# Patient Record
Sex: Female | Born: 1970 | Race: White | Hispanic: No | Marital: Married
Health system: Southern US, Community
[De-identification: ages and names within clinical notes are randomized; demographics above are authoritative.]

## PROBLEM LIST (undated history)

## (undated) DIAGNOSIS — J9621 Acute and chronic respiratory failure with hypoxia: Secondary | ICD-10-CM

## (undated) DIAGNOSIS — J690 Pneumonitis due to inhalation of food and vomit: Secondary | ICD-10-CM

## (undated) DIAGNOSIS — I5032 Chronic diastolic (congestive) heart failure: Secondary | ICD-10-CM

## (undated) DIAGNOSIS — G6182 Multifocal motor neuropathy: Secondary | ICD-10-CM

## (undated) DIAGNOSIS — R6520 Severe sepsis without septic shock: Secondary | ICD-10-CM

## (undated) DIAGNOSIS — A419 Sepsis, unspecified organism: Secondary | ICD-10-CM

---

## 2018-08-04 ENCOUNTER — Inpatient Hospital Stay
Admission: AD | Admit: 2018-08-04 | Discharge: 2018-09-30 | Disposition: A | Discharge status: Skilled Nursing Facility | Payer: Medicaid Other | Source: Other Acute Inpatient Hospital | Attending: Internal Medicine | Admitting: Internal Medicine

## 2018-08-04 ENCOUNTER — Other Ambulatory Visit (HOSPITAL_COMMUNITY): Payer: Medicaid Other

## 2018-08-04 DIAGNOSIS — R52 Pain, unspecified: Secondary | ICD-10-CM

## 2018-08-04 DIAGNOSIS — T85598A Other mechanical complication of other gastrointestinal prosthetic devices, implants and grafts, initial encounter: Secondary | ICD-10-CM

## 2018-08-04 DIAGNOSIS — T17908A Unspecified foreign body in respiratory tract, part unspecified causing other injury, initial encounter: Secondary | ICD-10-CM

## 2018-08-04 DIAGNOSIS — J189 Pneumonia, unspecified organism: Secondary | ICD-10-CM

## 2018-08-04 DIAGNOSIS — K56609 Unspecified intestinal obstruction, unspecified as to partial versus complete obstruction: Secondary | ICD-10-CM

## 2018-08-04 DIAGNOSIS — J9621 Acute and chronic respiratory failure with hypoxia: Secondary | ICD-10-CM | POA: Diagnosis present

## 2018-08-04 DIAGNOSIS — R0602 Shortness of breath: Secondary | ICD-10-CM

## 2018-08-04 DIAGNOSIS — Z931 Gastrostomy status: Secondary | ICD-10-CM

## 2018-08-04 DIAGNOSIS — G6182 Multifocal motor neuropathy: Secondary | ICD-10-CM | POA: Diagnosis present

## 2018-08-04 DIAGNOSIS — R6520 Severe sepsis without septic shock: Secondary | ICD-10-CM | POA: Diagnosis present

## 2018-08-04 DIAGNOSIS — Z4659 Encounter for fitting and adjustment of other gastrointestinal appliance and device: Secondary | ICD-10-CM

## 2018-08-04 DIAGNOSIS — J690 Pneumonitis due to inhalation of food and vomit: Secondary | ICD-10-CM | POA: Diagnosis present

## 2018-08-04 DIAGNOSIS — R1012 Left upper quadrant pain: Secondary | ICD-10-CM

## 2018-08-04 DIAGNOSIS — R34 Anuria and oliguria: Secondary | ICD-10-CM

## 2018-08-04 DIAGNOSIS — R230 Cyanosis: Secondary | ICD-10-CM

## 2018-08-04 DIAGNOSIS — R509 Fever, unspecified: Secondary | ICD-10-CM

## 2018-08-04 DIAGNOSIS — Z431 Encounter for attention to gastrostomy: Secondary | ICD-10-CM

## 2018-08-04 DIAGNOSIS — I5032 Chronic diastolic (congestive) heart failure: Secondary | ICD-10-CM | POA: Diagnosis present

## 2018-08-04 DIAGNOSIS — J9690 Respiratory failure, unspecified, unspecified whether with hypoxia or hypercapnia: Secondary | ICD-10-CM

## 2018-08-04 DIAGNOSIS — A419 Sepsis, unspecified organism: Secondary | ICD-10-CM | POA: Diagnosis present

## 2018-08-04 DIAGNOSIS — K567 Ileus, unspecified: Secondary | ICD-10-CM

## 2018-08-04 DIAGNOSIS — Z95828 Presence of other vascular implants and grafts: Secondary | ICD-10-CM

## 2018-08-04 HISTORY — DX: Acute and chronic respiratory failure with hypoxia: J96.21

## 2018-08-04 HISTORY — DX: Severe sepsis without septic shock: R65.20

## 2018-08-04 HISTORY — DX: Sepsis, unspecified organism: R65.20

## 2018-08-04 HISTORY — DX: Pneumonitis due to inhalation of food and vomit: J69.0

## 2018-08-04 HISTORY — DX: Multifocal motor neuropathy: G61.82

## 2018-08-04 HISTORY — DX: Chronic diastolic (congestive) heart failure: I50.32

## 2018-08-04 HISTORY — DX: Sepsis, unspecified organism: A41.9

## 2018-08-04 MED ORDER — IOHEXOL 300 MG/ML  SOLN
50.0000 mL | Freq: Once | INTRAMUSCULAR | Status: AC | PRN
  Administered 2018-08-04: 50 mL via INTRATHECAL

## 2018-08-05 ENCOUNTER — Encounter: Payer: Self-pay | Admitting: Internal Medicine

## 2018-08-05 DIAGNOSIS — J690 Pneumonitis due to inhalation of food and vomit: Secondary | ICD-10-CM | POA: Diagnosis not present

## 2018-08-05 DIAGNOSIS — A419 Sepsis, unspecified organism: Secondary | ICD-10-CM | POA: Diagnosis present

## 2018-08-05 DIAGNOSIS — I5032 Chronic diastolic (congestive) heart failure: Secondary | ICD-10-CM | POA: Diagnosis not present

## 2018-08-05 DIAGNOSIS — J9621 Acute and chronic respiratory failure with hypoxia: Secondary | ICD-10-CM | POA: Diagnosis not present

## 2018-08-05 DIAGNOSIS — G6182 Multifocal motor neuropathy: Secondary | ICD-10-CM

## 2018-08-05 DIAGNOSIS — R6520 Severe sepsis without septic shock: Secondary | ICD-10-CM

## 2018-08-05 LAB — CBC
HCT: 24.5 % — ABNORMAL LOW (ref 36.0–46.0)
HCT: 24.9 % — ABNORMAL LOW (ref 36.0–46.0)
Hemoglobin: 7.4 g/dL — ABNORMAL LOW (ref 12.0–15.0)
Hemoglobin: 7.6 g/dL — ABNORMAL LOW (ref 12.0–15.0)
MCH: 28.8 pg (ref 26.0–34.0)
MCH: 29 pg (ref 26.0–34.0)
MCHC: 30.2 g/dL (ref 30.0–36.0)
MCHC: 30.5 g/dL (ref 30.0–36.0)
MCV: 95.3 fL (ref 80.0–100.0)
MCV: 95 fL (ref 80.0–100.0)
Platelets: 487 10*3/uL — ABNORMAL HIGH (ref 150–400)
Platelets: 533 10*3/uL — ABNORMAL HIGH (ref 150–400)
RBC: 2.57 MIL/uL — ABNORMAL LOW (ref 3.87–5.11)
RBC: 2.62 MIL/uL — ABNORMAL LOW (ref 3.87–5.11)
RDW: 15.8 % — ABNORMAL HIGH (ref 11.5–15.5)
RDW: 15.8 % — ABNORMAL HIGH (ref 11.5–15.5)
WBC: 9.1 10*3/uL (ref 4.0–10.5)
WBC: 8.4 10*3/uL (ref 4.0–10.5)
nRBC: 0 % (ref 0.0–0.2)
nRBC: 0 % (ref 0.0–0.2)

## 2018-08-05 LAB — BASIC METABOLIC PANEL
Anion gap: 11 (ref 5–15)
BUN: 24 mg/dL — ABNORMAL HIGH (ref 6–20)
CO2: 36 mmol/L — ABNORMAL HIGH (ref 22–32)
Calcium: 8.7 mg/dL — ABNORMAL LOW (ref 8.9–10.3)
Chloride: 98 mmol/L (ref 98–111)
Creatinine, Ser: <0.3 mg/dL — ABNORMAL LOW (ref 0.44–1.00)
Glucose, Bld: 100 mg/dL — ABNORMAL HIGH (ref 70–99)
Potassium: 3.2 mmol/L — ABNORMAL LOW (ref 3.5–5.1)
Sodium: 145 mmol/L (ref 135–145)

## 2018-08-05 MED ORDER — RELION START KIT KIT
25.00 | PACK | Status: DC
Start: 2018-08-04 — End: 2018-08-05

## 2018-08-05 MED ORDER — Medication
Status: DC
Start: ? — End: 2018-08-05

## 2018-08-05 MED ORDER — LOSARTAN POTASSIUM 50 MG PO TABS
50.00 | ORAL_TABLET | ORAL | Status: DC
Start: 2018-08-05 — End: 2018-08-05

## 2018-08-05 MED ORDER — ACETAMINOPHEN 650 MG RE SUPP
650.00 | RECTAL | Status: DC
Start: ? — End: 2018-08-05

## 2018-08-05 MED ORDER — EQUATE NICOTINE 4 MG MT GUM
4.00 | CHEWING_GUM | OROMUCOSAL | Status: DC
Start: ? — End: 2018-08-05

## 2018-08-05 MED ORDER — METOPROLOL TARTRATE 50 MG PO TABS
100.00 | ORAL_TABLET | ORAL | Status: DC
Start: 2018-08-04 — End: 2018-08-05

## 2018-08-05 MED ORDER — CALCIUM-MAGNESIUM-VITAMIN D PO
0.50 | ORAL | Status: DC
Start: 2018-08-04 — End: 2018-08-05

## 2018-08-05 MED ORDER — NITROGLYCERIN 0.4 MG SL SUBL
.40 | SUBLINGUAL_TABLET | SUBLINGUAL | Status: DC
Start: ? — End: 2018-08-05

## 2018-08-05 MED ORDER — CELLULOSE SODIUM PHOSPHATE VI
5000.00 | Status: DC
Start: 2018-08-04 — End: 2018-08-05

## 2018-08-05 MED ORDER — ALOE VESTA 2-N-1 SKIN COND EX LOTN
30.00 | TOPICAL_LOTION | CUTANEOUS | Status: DC
Start: 2018-08-04 — End: 2018-08-05

## 2018-08-05 MED ORDER — GUAIFENESIN 100 MG/5ML PO LIQD
200.00 | ORAL | Status: DC
Start: ? — End: 2018-08-05

## 2018-08-05 MED ORDER — GENERIC EXTERNAL MEDICATION
8.00 | Status: DC
Start: 2018-08-05 — End: 2018-08-05

## 2018-08-05 MED ORDER — BARO-CAT PO
10.00 | ORAL | Status: DC
Start: ? — End: 2018-08-05

## 2018-08-05 MED ORDER — ASPIRIN 81 MG PO CHEW
81.00 | CHEWABLE_TABLET | ORAL | Status: DC
Start: 2018-08-05 — End: 2018-08-05

## 2018-08-05 MED ORDER — GENERIC EXTERNAL MEDICATION
600.00 | Status: DC
Start: 2018-08-05 — End: 2018-08-05

## 2018-08-05 MED ORDER — POLYETHYLENE GLYCOL 3350 17 G PO PACK
17.00 | PACK | ORAL | Status: DC
Start: 2018-08-05 — End: 2018-08-05

## 2018-08-05 MED ORDER — IPRATROPIUM BROMIDE 0.02 % IN SOLN
.50 | RESPIRATORY_TRACT | Status: DC
Start: ? — End: 2018-08-05

## 2018-08-05 MED ORDER — ACETAMINOPHEN 160 MG/5ML PO SUSP
650.00 | ORAL | Status: DC
Start: 2018-08-04 — End: 2018-08-05

## 2018-08-05 NOTE — Consult Note (Signed)
Pulmonary Critical Care Medicine [facility] [place]  PULMONARY SERVICE  Date of Service: 08/05/2018  PULMONARY CRITICAL CARE CONSULT   Tiffany Atkinson  LKJ:[id number]  DOB: 1971/03/26   DOA: 08/04/2018  Referring Physician: Carron Curie, MD  HPI: Tiffany Atkinson is a 48 y.o. female seen for follow up of Acute on Chronic Respiratory Failure.  Patient has multiple medical problems including seizure disorder and posttraumatic stress disorder.  Patient had been recently admitted to the other facility with the possibility of Guillain-Barr syndrome this was apparently ruled out.  Patient presented with acute respiratory failure again with a diagnosis of pneumonia.  Patient ended up having to be intubated and placed on the ventilator.  Initially had been placed on BiPAP which did not work.  Subsequently patient had decline in status CT chest had shown the right lower lobe pneumonia there was concern for aspiration.  Patient did have cultures done sputum showed MRSA and so therefore was started on antibiotics.  As far as her neurological status is concerned she had EMG as well as neurological evaluation and it was felt that the changes were related to nutritional or metabolic disorder.  Patient did receive IVIG without any significant improvement.  Patient did undergo tracheostomy and a PEG tube placement prior to being transferred to our facility.  Right now is comfortable without distress.  Hospital course was as follows.  Patient was attempted several times and weaning did not do well.  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  Patient continued to fail assessments also failed SLP if assessment.  Tracheostomy as mentioned was done and then started weaning on trach collars on 3 May.  She was transferred to our facility for further management.  In addition at the other facility apparently patient was made DNR  Review of Systems:  ROS performed and is unremarkable other than noted above.   Past  Medical History:  Diagnosis Date  ?Marland Kitchen Anemia  PAST  ?Marland Kitchen Mastalgia 09/09/2017  ?Marland Kitchen Migraine  ?. Migraines  ?Marland Kitchen Nonpsychotic mental disorder  ?Marland Kitchen Ovarian cyst  ?. Ovarian cyst  BIL  ?Marland Kitchen Pelvic mass in female  ?. Seizure (*)  last seizure along time ago   Past Surgical History:  Procedure Laterality Date  ?. Bladder repair  ?. Cesarean section  ?Marland Kitchen Cholecystectomy  ?Marland Kitchen Hysterectomy  BSO  ?. Tubal ligation   Allergies  Allergen Reactions  ?Marland Kitchen Maxalt [Rizatriptan] Shortness Of Breath, Palpitations and Other  syncope syncope  ?Marland Kitchen Maxolon [Metoclopramide] Palpitations  ?Marland Kitchen Tramadol Shortness Of Breath  ?. Diclofenac Potassium Other  Pt reports she felt like she was going to pass out.  ?. Omeprazole Other  ?Marland Kitchen Potassium Nausea And Vomiting  ?. Sucralfate Other  ?Marland Kitchen Topamax [Topiramate] Other  Pt states "it makes me feel like I'm going to pass out"  ?. Unknown [Other] Other  Pt states there is a "powdered headache" medication that causes Syncope- she does not know the name.   Social History   Socioeconomic History  ?. Marital status: Married  Spouse name: Not on file  ?. Number of children: Not on file  ?. Years of education: Not on file  ?Marland Kitchen Highest education level: Not on file  Occupational History  ?Marland Kitchen Occupation: disabled  Social Needs  ?Marland Kitchen Financial resource strain: Not on file  ?Marland Kitchen Food insecurity  Worry: Not on file  Inability: Not on file  ?Marland Kitchen Transportation needs  Medical: Not on file  Non-medical: Not on file  Tobacco  Use  ?. Smoking status: Never Smoker  ?. Smokeless tobacco: Never Used  Substance and Sexual Activity  ?Marland Kitchen Alcohol use: No  Alcohol/week: 0.0 standard drinks  ?. Drug use: No      Family History: Non-Contributory to the present illness    Medications: Reviewed on Rounds  Physical Exam:  Vitals: Temperature 98.6 pulse 98 respiratory 15 blood pressure 110/72 saturations 100%  Ventilator Settings currently is off the ventilator on T  collar  . General: Comfortable at this time . Eyes: Grossly normal lids, irises & conjunctiva . ENT: grossly tongue is normal . Neck: no obvious mass . Cardiovascular: S1-S2 normal no gallop or rub . Respiratory: Coarse rhonchi noted bilaterally . Abdomen: Soft nontender . Skin: no rash seen on limited exam . Musculoskeletal: not rigid . Psychiatric:unable to assess . Neurologic: no seizure no involuntary movements         Labs on Admission:  Basic Metabolic Panel: Recent Labs  Lab 08/05/18 0537  NA 145  K 3.2*  CL 98  CO2 36*  GLUCOSE 100*  BUN 24*  CREATININE <0.30*  CALCIUM 8.7*    No results for input(s): PHART, PCO2ART, PO2ART, HCO3, O2SAT in the last 168 hours.  Liver Function Tests: No results for input(s): AST, ALT, ALKPHOS, BILITOT, PROT, ALBUMIN in the last 168 hours. No results for input(s): LIPASE, AMYLASE in the last 168 hours. No results for input(s): AMMONIA in the last 168 hours.  CBC: Recent Labs  Lab 08/05/18 0537 08/05/18 1448  WBC 9.1 8.4  HGB 7.4* 7.6*  HCT 24.5* 24.9*  MCV 95.3 95.0  PLT 487* 533*    Cardiac Enzymes: No results for input(s): CKTOTAL, CKMB, CKMBINDEX, TROPONINI in the last 168 hours.  BNP (last 3 results) No results for input(s): BNP in the last 8760 hours.  ProBNP (last 3 results) No results for input(s): PROBNP in the last 8760 hours.   Radiological Exams on Admission: Dg Abdomen Peg Tube Location  Result Date: 08/04/2018 CLINICAL DATA:  Peg tube verification EXAM: ABDOMEN - 1 VIEW COMPARISON:  None. FINDINGS: Single-view abdomen. Water-soluble contrast was injected through pre-existing PEG tube and a single overhead image obtained. There is contrast opacification of the stomach and proximal duodenum. There is reflux of contrast into the distal esophagus. No gross extravasation. There is diffuse gaseous enlargement of the colon. IMPRESSION: Contrast injection of peg tube opacifies the stomach, no gross extravasation.  Reflux of contrast to the distal esophagus. Diffuse gaseous dilatation of bowel, possible ileus Electronically Signed   By: Jasmine Pang M.D.   On: 08/04/2018 20:48    Assessment/Plan Active Problems:   Acute on chronic respiratory failure with hypoxia (HCC)   Aspiration pneumonia due to gastric secretions (HCC)   MMN (multifocal motor neuropathy) (HCC)   Severe sepsis (HCC)   Diastolic dysfunction with chronic heart failure (HCC)   1. Acute on chronic respiratory failure with hypoxia we will continue with weaning patient has been on a wean protocol and has been tolerating tracheostomy at the other facility.  We will continue to work on build on this and advance as tolerated. 2. Aspiration pneumonia treated we will continue present management supportive care. 3. Multifocal axonal motor neuropathy unclear etiology we will continue with supportive care. 4. Sepsis with bacteremia patient was treated will follow-up on cultures as necessary right now patient is hemodynamically stable. 5. Chronic diastolic heart failure monitor fluid status diuresis tolerated  I have personally seen and evaluated the patient, evaluated laboratory and  imaging results, formulated the assessment and plan and placed orders. The Patient requires high complexity decision making for assessment and support.  Case was discussed on Rounds with the Respiratory Therapy Staff Time Spent 70minutes  Yevonne PaxSaadat A Petro Talent, MD [facility] Pulmonary Critical Care Medicine Sleep Medicine

## 2018-08-06 ENCOUNTER — Other Ambulatory Visit (HOSPITAL_COMMUNITY): Payer: Medicaid Other

## 2018-08-06 DIAGNOSIS — J690 Pneumonitis due to inhalation of food and vomit: Secondary | ICD-10-CM | POA: Diagnosis not present

## 2018-08-06 DIAGNOSIS — A419 Sepsis, unspecified organism: Secondary | ICD-10-CM | POA: Diagnosis not present

## 2018-08-06 DIAGNOSIS — I5032 Chronic diastolic (congestive) heart failure: Secondary | ICD-10-CM | POA: Diagnosis not present

## 2018-08-06 DIAGNOSIS — J9621 Acute and chronic respiratory failure with hypoxia: Secondary | ICD-10-CM | POA: Diagnosis not present

## 2018-08-06 LAB — CBC WITH DIFFERENTIAL/PLATELET
Abs Immature Granulocytes: 0.04 10*3/uL (ref 0.00–0.07)
Basophils Absolute: 0.1 10*3/uL (ref 0.0–0.1)
Basophils Relative: 1 %
Eosinophils Absolute: 0 10*3/uL (ref 0.0–0.5)
Eosinophils Relative: 0 %
HCT: 27.6 % — ABNORMAL LOW (ref 36.0–46.0)
Hemoglobin: 8.5 g/dL — ABNORMAL LOW (ref 12.0–15.0)
Immature Granulocytes: 0 %
Lymphocytes Relative: 15 %
Lymphs Abs: 1.4 10*3/uL (ref 0.7–4.0)
MCH: 29 pg (ref 26.0–34.0)
MCHC: 30.8 g/dL (ref 30.0–36.0)
MCV: 94.2 fL (ref 80.0–100.0)
Monocytes Absolute: 0.5 10*3/uL (ref 0.1–1.0)
Monocytes Relative: 6 %
Neutro Abs: 7.1 10*3/uL (ref 1.7–7.7)
Neutrophils Relative %: 78 %
Platelets: 595 10*3/uL — ABNORMAL HIGH (ref 150–400)
RBC: 2.93 MIL/uL — ABNORMAL LOW (ref 3.87–5.11)
RDW: 15.5 % (ref 11.5–15.5)
WBC: 9.1 10*3/uL (ref 4.0–10.5)
nRBC: 0 % (ref 0.0–0.2)

## 2018-08-06 LAB — BLOOD GAS, ARTERIAL
Acid-Base Excess: 10.3 mmol/L — ABNORMAL HIGH (ref 0.0–2.0)
Bicarbonate: 35.1 mmol/L — ABNORMAL HIGH (ref 20.0–28.0)
FIO2: 28
O2 Saturation: 98.2 %
Patient temperature: 98.6
pCO2 arterial: 53.9 mmHg — ABNORMAL HIGH (ref 32.0–48.0)
pH, Arterial: 7.429 (ref 7.350–7.450)
pO2, Arterial: 98.9 mmHg (ref 83.0–108.0)

## 2018-08-06 LAB — C DIFFICILE QUICK SCREEN W PCR REFLEX
C Diff antigen: NEGATIVE
C Diff interpretation: NOT DETECTED
C Diff toxin: NEGATIVE

## 2018-08-06 MED ORDER — Medication
Status: DC
Start: ? — End: 2018-08-06

## 2018-08-06 NOTE — Progress Notes (Addendum)
Pulmonary Critical Care Medicine [facility] [place]   PULMONARY CRITICAL CARE SERVICE  PROGRESS NOTE  Date of Service: 08/06/2018  Tiffany Atkinson  [id number]  DOB: October 01, 1970   DOA: 08/04/2018  Referring Physician: Carron Curie, MD  HPI: Tiffany Atkinson is a 48 y.o. female seen for follow up of Acute on Chronic Respiratory Failure.  Patient remains on assist control mode rate of 12 with a FiO2 of 30%.  He is weaning to aerosol trach collar 28% FiO2  Medications: Reviewed on Rounds  Physical Exam:  Vitals: Pulse 100 respirations 18 BP 99/62 O2 sat 97% temp 97.6  Ventilator Settings auto mode AC VC rate of 12 tidal line 320 PEEP of 5 FiO2 30%  . General: Comfortable at this time . Eyes: Grossly normal lids, irises & conjunctiva . ENT: grossly tongue is normal . Neck: no obvious mass . Cardiovascular: S1 S2 normal no gallop . Respiratory: Coarse breath sounds . Abdomen: soft . Skin: no rash seen on limited exam . Musculoskeletal: not rigid . Psychiatric:unable to assess . Neurologic: no seizure no involuntary movements         Lab Data:   Basic Metabolic Panel: Recent Labs  Lab 08/05/18 0537  NA 145  K 3.2*  CL 98  CO2 36*  GLUCOSE 100*  BUN 24*  CREATININE <0.30*  CALCIUM 8.7*    ABG: Recent Labs  Lab 08/06/18 0605  PHART 7.429  PCO2ART 53.9*  PO2ART 98.9  HCO3 35.1*  O2SAT 98.2    Liver Function Tests: No results for input(s): AST, ALT, ALKPHOS, BILITOT, PROT, ALBUMIN in the last 168 hours. No results for input(s): LIPASE, AMYLASE in the last 168 hours. No results for input(s): AMMONIA in the last 168 hours.  CBC: Recent Labs  Lab 08/05/18 0537 08/05/18 1448 08/06/18 0324  WBC 9.1 8.4 9.1  NEUTROABS  --   --  7.1  HGB 7.4* 7.6* 8.5*  HCT 24.5* 24.9* 27.6*  MCV 95.3 95.0 94.2  PLT 487* 533* 595*    Cardiac Enzymes: No results for input(s): CKTOTAL, CKMB, CKMBINDEX, TROPONINI in the last 168 hours.  BNP (last 3  results) No results for input(s): BNP in the last 8760 hours.  ProBNP (last 3 results) No results for input(s): PROBNP in the last 8760 hours.  Radiological Exams: Dg Abdomen Peg Tube Location  Result Date: 08/04/2018 CLINICAL DATA:  Peg tube verification EXAM: ABDOMEN - 1 VIEW COMPARISON:  None. FINDINGS: Single-view abdomen. Water-soluble contrast was injected through pre-existing PEG tube and a single overhead image obtained. There is contrast opacification of the stomach and proximal duodenum. There is reflux of contrast into the distal esophagus. No gross extravasation. There is diffuse gaseous enlargement of the colon. IMPRESSION: Contrast injection of peg tube opacifies the stomach, no gross extravasation. Reflux of contrast to the distal esophagus. Diffuse gaseous dilatation of bowel, possible ileus Electronically Signed   By: Jasmine Pang M.D.   On: 08/04/2018 20:48   Dg Abd Portable 1v  Result Date: 08/06/2018 CLINICAL DATA:  Ileus EXAM: PORTABLE ABDOMEN - 1 VIEW COMPARISON:  Two days ago FINDINGS: Gaseous small and large bowel dilatation. Large bowel dilatation has somewhat improved especially in the left colon. The cecum measures up to 8 cm in diameter. Oral contrast seen previously is no longer visualized over the stomach, likely now positioned at the level of the rectum. IMPRESSION: Continued ileus pattern but improved distension. Electronically Signed   By: Marnee Spring M.D.   On:  08/06/2018 07:38    Assessment/Plan Active Problems:   Acute on chronic respiratory failure with hypoxia (HCC)   Aspiration pneumonia due to gastric secretions (HCC)   MMN (multifocal motor neuropathy) (HCC)   Severe sepsis (HCC)   Diastolic dysfunction with chronic heart failure (HCC)   1. Acute on chronic respiratory failure with hypoxia continue with weaning patient per weaning protocol.  Continue supportive measures and pulmonary toilet 2. Aspiration pneumonia treated continue present  management 3. 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 Sepsis with bacteremia treated, continue to follow 5. Chronic diastolic heart failure monitor fluid status and diuretics as tolerated   I have personally seen and evaluated the patient, evaluated laboratory and imaging results, formulated the assessment and plan and placed orders. The Patient requires high complexity decision making for assessment and support.  Case was discussed on Rounds with the Respiratory Therapy Staff  Tiffany PaxSaadat A Merryn Thaker, MD [facility] Pulmonary Critical Care Medicine Sleep Medicine

## 2018-08-07 ENCOUNTER — Other Ambulatory Visit (HOSPITAL_COMMUNITY): Payer: Medicaid Other

## 2018-08-07 DIAGNOSIS — J690 Pneumonitis due to inhalation of food and vomit: Secondary | ICD-10-CM | POA: Diagnosis not present

## 2018-08-07 DIAGNOSIS — A419 Sepsis, unspecified organism: Secondary | ICD-10-CM | POA: Diagnosis not present

## 2018-08-07 DIAGNOSIS — J9621 Acute and chronic respiratory failure with hypoxia: Secondary | ICD-10-CM | POA: Diagnosis not present

## 2018-08-07 DIAGNOSIS — I5032 Chronic diastolic (congestive) heart failure: Secondary | ICD-10-CM | POA: Diagnosis not present

## 2018-08-07 MED ORDER — GENERIC EXTERNAL MEDICATION
Status: DC
Start: ? — End: 2018-08-07

## 2018-08-07 NOTE — Progress Notes (Addendum)
[facility] [place]   PULMONARY CRITICAL CARE SERVICE  PROGRESS NOTE  Date of Service: 08/07/2018  Tiffany Atkinson  ZOX:[id number]  DOB: November 21, 1970   DOA: 08/04/2018  Referring Physician: Carron CurieAli Hijazi, MD  HPI: Tiffany Atkinson is a 48 y.o. female seen for follow up of Acute on Chronic Respiratory Failure.  Patient has now done 48 hours on aerosol trach collar 28% FiO2.  Resting comfortably with no distress.  Medications: Reviewed on Rounds  Physical Exam:  Vitals: Pulse 110 respirations 18 BP 135/78 O2 sat 98% temp 98.2  Ventilator Settings 28% ATC  . General: Comfortable at this time . Eyes: Grossly normal lids, irises & conjunctiva . ENT: grossly tongue is normal . Neck: no obvious mass . Cardiovascular: S1 S2 normal no gallop . Respiratory: Coarse breath sounds . Abdomen: soft . Skin: no rash seen on limited exam . Musculoskeletal: not rigid . Psychiatric:unable to assess . Neurologic: no seizure no involuntary movements         Lab Data:   Basic Metabolic Panel: Recent Labs  Lab 08/05/18 0537  NA 145  K 3.2*  CL 98  CO2 36*  GLUCOSE 100*  BUN 24*  CREATININE <0.30*  CALCIUM 8.7*    ABG: Recent Labs  Lab 08/06/18 0605  PHART 7.429  PCO2ART 53.9*  PO2ART 98.9  HCO3 35.1*  O2SAT 98.2    Liver Function Tests: No results for input(s): AST, ALT, ALKPHOS, BILITOT, PROT, ALBUMIN in the last 168 hours. No results for input(s): LIPASE, AMYLASE in the last 168 hours. No results for input(s): AMMONIA in the last 168 hours.  CBC: Recent Labs  Lab 08/05/18 0537 08/05/18 1448 08/06/18 0324  WBC 9.1 8.4 9.1  NEUTROABS  --   --  7.1  HGB 7.4* 7.6* 8.5*  HCT 24.5* 24.9* 27.6*  MCV 95.3 95.0 94.2  PLT 487* 533* 595*    Cardiac Enzymes: No results for input(s): CKTOTAL, CKMB, CKMBINDEX, TROPONINI in the last 168 hours.  BNP (last 3 results) No results for input(s): BNP in the last 8760 hours.  ProBNP (last  3 results) No results for input(s): PROBNP in the last 8760 hours.  Radiological Exams: Dg Abd 1 View  Result Date: 08/07/2018 CLINICAL DATA:  Ileus. Sepsis. EXAM: ABDOMEN - 1 VIEW COMPARISON:  08/06/2018 FINDINGS: There is persistent diffuse gaseous distension of small and large bowel throughout the abdomen. The cecum measures up to 8 cm in diameter, not significantly changed. Left-sided colonic dilatation has minimally increased while small bowel dilatation is stable to slightly increased. Contrast is again noted in the rectum. Right upper quadrant abdominal surgical clips are noted. IMPRESSION: Persistent ileus pattern with minimally increased bowel distension. Electronically Signed   By: Sebastian AcheAllen  Grady M.D.   On: 08/07/2018 09:40   Dg Abd Portable 1v  Result Date: 08/06/2018 CLINICAL DATA:  Ileus EXAM: PORTABLE ABDOMEN - 1 VIEW COMPARISON:  Two days ago FINDINGS: Gaseous small and large bowel dilatation. Large bowel dilatation has somewhat improved especially in the left colon. The cecum measures up to 8 cm in diameter. Oral contrast seen previously is no longer visualized over the stomach, likely now positioned at the level of the rectum. IMPRESSION: Continued ileus pattern but improved distension. Electronically Signed   By: Marnee SpringJonathon  Watts M.D.   On: 08/06/2018 07:38    Assessment/Plan Active Problems:   Acute on chronic respiratory failure with hypoxia (HCC)   Aspiration pneumonia due to gastric secretions (HCC)  MMN (multifocal motor neuropathy) (HCC)   Severe sepsis (HCC)   Diastolic dysfunction with chronic heart failure (HCC)   1. Acute on chronic respiratory failure with hypoxia continue with weaning patient per protocol continue supportive measures and pulmonary toilet 2. Aspiration pneumonia treated continue present management 3. Multifocal axonal motor neuropathy unclear etiology continue supportive care 4. Sepsis with bacteremia treated hemodynamically stable continue to  follow 5. Chronic diastolic heart failure monitor fluid status and diuresis as tolerated   I have personally seen and evaluated the patient, evaluated laboratory and imaging results, formulated the assessment and plan and placed orders. The Patient requires high complexity decision making for assessment and support.  Case was discussed on Rounds with the Respiratory Therapy Staff  Yevonne Pax, MD [facility] Pulmonary Critical Care Medicine Sleep Medicine

## 2018-08-08 ENCOUNTER — Other Ambulatory Visit (HOSPITAL_COMMUNITY): Payer: Medicaid Other

## 2018-08-08 DIAGNOSIS — J9621 Acute and chronic respiratory failure with hypoxia: Secondary | ICD-10-CM | POA: Diagnosis not present

## 2018-08-08 DIAGNOSIS — J690 Pneumonitis due to inhalation of food and vomit: Secondary | ICD-10-CM | POA: Diagnosis not present

## 2018-08-08 DIAGNOSIS — A419 Sepsis, unspecified organism: Secondary | ICD-10-CM | POA: Diagnosis not present

## 2018-08-08 DIAGNOSIS — I5032 Chronic diastolic (congestive) heart failure: Secondary | ICD-10-CM | POA: Diagnosis not present

## 2018-08-08 LAB — URINALYSIS, ROUTINE W REFLEX MICROSCOPIC
Bacteria, UA: NONE SEEN
Bilirubin Urine: NEGATIVE
Glucose, UA: NEGATIVE mg/dL
Ketones, ur: NEGATIVE mg/dL
Leukocytes,Ua: NEGATIVE
Nitrite: NEGATIVE
Protein, ur: 30 mg/dL — AB
RBC / HPF: >50 RBC/hpf — ABNORMAL HIGH (ref 0–5)
Specific Gravity, Urine: 1.018 (ref 1.005–1.030)
pH: 5 (ref 5.0–8.0)

## 2018-08-08 MED ORDER — Medication
Status: DC
Start: ? — End: 2018-08-08

## 2018-08-08 MED ORDER — GENERIC EXTERNAL MEDICATION
Status: DC
Start: ? — End: 2018-08-08

## 2018-08-08 NOTE — Progress Notes (Addendum)
Pulmonary Critical Care Medicine [facility] [place]   PULMONARY CRITICAL CARE SERVICE  [facility]  Date of Service: 08/09/2018  Tiffany Atkinson [place]  WUX:[id number]  DOB: Oct 20, 1970   DOA: 08/04/2018  Referring Physician: Carron CurieAli Hijazi, MD  HPI: Tiffany Atkinson Tiffany Atkinson is Atkinson 48 y.o. female seen for follow up of Acute on Chronic Respiratory Failure.  Patient remains on aerosol trach collar 35% at this time.  Resting comfortably with no distress  Medications: Reviewed on Rounds  Physical Exam:  Vitals: Pulse 97 respirations 22 BP 159/94 O2 sat 91% temp 97.6  Ventilator Settings 28% ATC  . General: Comfortable at this time . Eyes: Grossly normal lids, irises & conjunctiva . ENT: grossly tongue is normal . Neck: no obvious mass . Cardiovascular: S1 S2 normal no gallop . Respiratory: Coarse breath sounds . Abdomen: soft . Skin: no rash seen on limited exam . Musculoskeletal: not rigid . Psychiatric:unable to assess . Neurologic: no seizure no involuntary movements         Lab Data:   Basic Metabolic Panel: Recent Labs  Lab 08/05/18 0537 08/09/18 0602 08/09/18 1049  NA 145 142 140  K 3.2* 2.3* 3.8  CL 98 97* 100  CO2 36* 36* 32  GLUCOSE 100* 111* 99  BUN 24* 10 12  CREATININE <0.30* <0.30* <0.30*  CALCIUM 8.7* 8.1* 8.1*    ABG: Recent Labs  Lab 08/06/18 0605 08/09/18 0830  PHART 7.429 7.383  PCO2ART 53.9* 62.6*  PO2ART 98.9 65.6*  HCO3 35.1* 36.5*  O2SAT 98.2 92.0    Liver Function Tests: Recent Labs  Lab 08/09/18 1049  AST 33  ALT 24  ALKPHOS 63  BILITOT 0.5  PROT 5.0*  ALBUMIN 2.1*   No results for input(s): LIPASE, AMYLASE in the last 168 hours. Recent Labs  Lab 08/09/18 1050  AMMONIA 56*    CBC: Recent Labs  Lab 08/05/18 0537 08/05/18 1448 08/06/18 0324 08/09/18 1049  WBC 9.1 8.4 9.1 9.3  NEUTROABS  --   --  7.1  --   HGB 7.4* 7.6* 8.5* 8.2*  HCT 24.5* 24.9* 27.6* 25.9*  MCV 95.3 95.0 94.2 91.8  PLT 487* 533* 595* 679*     Cardiac Enzymes: No results for input(s): CKTOTAL, CKMB, CKMBINDEX, TROPONINI in the last 168 hours.  BNP (last 3 results) No results for input(s): BNP in the last 8760 hours.  ProBNP (last 3 results) No results for input(s): PROBNP in the last 8760 hours.  Radiological Exams: Dg Abd 1 View  Result Date: 08/08/2018 CLINICAL DATA:  Ileus. Central abdominal pain. EXAM: ABDOMEN - 1 VIEW COMPARISON:  08/07/2018 FINDINGS: Diffuse gaseous distension of the colon has not significantly changed. Gaseous small bowel distention has decreased. Contrast remains in the rectum. No gross intraperitoneal free air is identified on this supine study. Right upper quadrant abdominal surgical clips are noted. No acute osseous abnormality is seen. IMPRESSION: Persistent ileus pattern with decreased small bowel distension. Electronically Signed   By: Sebastian AcheAllen  Grady M.D.   On: 08/08/2018 07:50   [place] Renal  Result Date: 08/08/2018 CLINICAL DATA:  Decreased urinary output. EXAM: RENAL / URINARY TRACT ULTRASOUND COMPLETE COMPARISON:  None. FINDINGS: Suboptimal assessment due to patient inability to change position and poor sonic windows. Right Kidney: Renal measurements: 11.4 by 5.3 by 5.3 = volume: 170 mL . Echogenicity within normal limits. No mass or hydronephrosis visualized. Left Kidney: Renal measurements: 12.3 by 7.0 by 6.5 cm = volume: 292 mL. Echogenicity within normal limits. No  mass or hydronephrosis visualized. The lower pole the left kidney was poorly seen due to difficult sonic windows. Bladder: Appears normal for degree of bladder distention. Atkinson Foley catheter is present. Other: Perihepatic and pelvic ascites. IMPRESSION: 1. Ascites. 2. No specific renal or bladder abnormality is identified, but sensitivity is mildly reduced due to poor sonic windows causing difficulties visualizing parts of the kidneys including the left kidney lower pole. Electronically Signed   By: Gaylyn Rong M.D.   On: 08/08/2018  17:58   Dg Abd Portable 1v  Result Date: 08/09/2018 CLINICAL DATA:  48 year old female with sepsis, possible ileus. EXAM: PORTABLE ABDOMEN - 1 VIEW COMPARISON:  08/07/2018 and earlier. FINDINGS: Portable AP supine view at 0607 hours. Gas-filled small and large bowel loops throughout the abdomen with stable caliber at the upper limits of normal since 08/06/2018. No pneumoperitoneum evident on this supine view. Stable cholecystectomy clips. Negative visible osseous structures. IMPRESSION: Stable gas-filled small and large bowel loops throughout the abdomen since 08/06/2018 compatible with ileus. Electronically Signed   By: Odessa Fleming M.D.   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 Acute on chronic respiratory failure with hypoxia continue weaning per protocol.  Continue supportive measures pulmonary toilet 2. Aspiration pneumonia treated continue present management 3. Multifocal anoxia motor neuropathy unclear etiology continue supportive care 4. Sepsis with bacteremia treated hemodynamically stable 5. Chronic diastolic heart failure monitor fluid status and diurese as tolerated   I have personally seen and evaluated the patient, evaluated laboratory and imaging results, formulated the assessment and plan and placed orders. The Patient requires high complexity decision making for assessment and support.  Case was discussed on Rounds with the Respiratory Therapy Staff  Yevonne Pax, MD [facility] Pulmonary Critical Care Medicine Sleep Medicine

## 2018-08-09 ENCOUNTER — Other Ambulatory Visit (HOSPITAL_COMMUNITY): Payer: Medicaid Other

## 2018-08-09 ENCOUNTER — Encounter: Payer: Self-pay | Admitting: Radiology

## 2018-08-09 DIAGNOSIS — J690 Pneumonitis due to inhalation of food and vomit: Secondary | ICD-10-CM | POA: Diagnosis not present

## 2018-08-09 DIAGNOSIS — I5032 Chronic diastolic (congestive) heart failure: Secondary | ICD-10-CM | POA: Diagnosis not present

## 2018-08-09 DIAGNOSIS — J9621 Acute and chronic respiratory failure with hypoxia: Secondary | ICD-10-CM | POA: Diagnosis not present

## 2018-08-09 DIAGNOSIS — A419 Sepsis, unspecified organism: Secondary | ICD-10-CM | POA: Diagnosis not present

## 2018-08-09 LAB — CBC
HCT: 25.9 % — ABNORMAL LOW (ref 36.0–46.0)
Hemoglobin: 8.2 g/dL — ABNORMAL LOW (ref 12.0–15.0)
MCH: 29.1 pg (ref 26.0–34.0)
MCHC: 31.7 g/dL (ref 30.0–36.0)
MCV: 91.8 fL (ref 80.0–100.0)
Platelets: 679 10*3/uL — ABNORMAL HIGH (ref 150–400)
RBC: 2.82 MIL/uL — ABNORMAL LOW (ref 3.87–5.11)
RDW: 15 % (ref 11.5–15.5)
WBC: 9.3 10*3/uL (ref 4.0–10.5)
nRBC: 0 % (ref 0.0–0.2)

## 2018-08-09 LAB — AMMONIA: Ammonia: 56 umol/L — ABNORMAL HIGH (ref 9–35)

## 2018-08-09 LAB — BASIC METABOLIC PANEL
Anion gap: 9 (ref 5–15)
BUN: 10 mg/dL (ref 6–20)
CO2: 36 mmol/L — ABNORMAL HIGH (ref 22–32)
Calcium: 8.1 mg/dL — ABNORMAL LOW (ref 8.9–10.3)
Chloride: 97 mmol/L — ABNORMAL LOW (ref 98–111)
Creatinine, Ser: <0.3 mg/dL — ABNORMAL LOW (ref 0.44–1.00)
Glucose, Bld: 111 mg/dL — ABNORMAL HIGH (ref 70–99)
Potassium: 2.3 mmol/L — CL (ref 3.5–5.1)
Sodium: 142 mmol/L (ref 135–145)

## 2018-08-09 LAB — COMPREHENSIVE METABOLIC PANEL
ALT: 24 U/L (ref 0–44)
AST: 33 U/L (ref 15–41)
Albumin: 2.1 g/dL — ABNORMAL LOW (ref 3.5–5.0)
Alkaline Phosphatase: 63 U/L (ref 38–126)
Anion gap: 8 (ref 5–15)
BUN: 12 mg/dL (ref 6–20)
CO2: 32 mmol/L (ref 22–32)
Calcium: 8.1 mg/dL — ABNORMAL LOW (ref 8.9–10.3)
Chloride: 100 mmol/L (ref 98–111)
Creatinine, Ser: <0.3 mg/dL — ABNORMAL LOW (ref 0.44–1.00)
Glucose, Bld: 99 mg/dL (ref 70–99)
Potassium: 3.8 mmol/L (ref 3.5–5.1)
Sodium: 140 mmol/L (ref 135–145)
Total Bilirubin: 0.5 mg/dL (ref 0.3–1.2)
Total Protein: 5 g/dL — ABNORMAL LOW (ref 6.5–8.1)

## 2018-08-09 LAB — BLOOD GAS, ARTERIAL
Acid-Base Excess: 11.1 mmol/L — ABNORMAL HIGH (ref 0.0–2.0)
Bicarbonate: 36.5 mmol/L — ABNORMAL HIGH (ref 20.0–28.0)
FIO2: 60
O2 Saturation: 92 %
Patient temperature: 98.6
pCO2 arterial: 62.6 mmHg — ABNORMAL HIGH (ref 32.0–48.0)
pH, Arterial: 7.383 (ref 7.350–7.450)
pO2, Arterial: 65.6 mmHg — ABNORMAL LOW (ref 83.0–108.0)

## 2018-08-09 LAB — LACTIC ACID, PLASMA: Lactic Acid, Venous: 0.5 mmol/L (ref 0.5–1.9)

## 2018-08-09 LAB — PROCALCITONIN: Procalcitonin: <0.1 ng/mL

## 2018-08-09 MED ORDER — Medication
Status: DC
Start: ? — End: 2018-08-09

## 2018-08-09 MED ORDER — IOHEXOL 300 MG/ML  SOLN
100.0000 mL | Freq: Once | INTRAMUSCULAR | Status: AC | PRN
  Administered 2018-09-08: 21:00:00 100 mL via INTRAVENOUS

## 2018-08-09 NOTE — Progress Notes (Addendum)
Pulmonary Critical Care Medicine [facility] [place]   PULMONARY CRITICAL CARE SERVICE  PROGRESS NOTE  Date of Service: 08/09/2018  Tiffany Atkinson  SEG:[id number]  DOB: 1970-06-24   DOA: 08/04/2018  Referring Physician: Carron Curie, MD  HPI: Tiffany Atkinson is a 48 y.o. female seen for follow up of Acute on Chronic Respiratory Failure.  Patient remains on 40% aerosol trach collar still requiring Ventimask at night at times currently resting comfortably with no acute distress.  Medications: Reviewed on Rounds  Physical Exam:  Vitals: Pulse 91 respirations 21 BP 139/76 O2 sat 94% temp 98.9  Ventilator Settings ATC 40%  . General: Comfortable at this time . Eyes: Grossly normal lids, irises & conjunctiva . ENT: grossly tongue is normal . Neck: no obvious mass . Cardiovascular: S1 S2 normal no gallop . Respiratory: Coarse breath sounds . Abdomen: soft . Skin: no rash seen on limited exam . Musculoskeletal: not rigid . Psychiatric:unable to assess . Neurologic: no seizure no involuntary movements         Lab Data:   Basic Metabolic Panel: Recent Labs  Lab 08/05/18 0537 08/09/18 0602 08/09/18 1049  NA 145 142 140  K 3.2* 2.3* 3.8  CL 98 97* 100  CO2 36* 36* 32  GLUCOSE 100* 111* 99  BUN 24* 10 12  CREATININE <0.30* <0.30* <0.30*  CALCIUM 8.7* 8.1* 8.1*    ABG: Recent Labs  Lab 08/06/18 0605 08/09/18 0830  PHART 7.429 7.383  PCO2ART 53.9* 62.6*  PO2ART 98.9 65.6*  HCO3 35.1* 36.5*  O2SAT 98.2 92.0    Liver Function Tests: Recent Labs  Lab 08/09/18 1049  AST 33  ALT 24  ALKPHOS 63  BILITOT 0.5  PROT 5.0*  ALBUMIN 2.1*   No results for input(s): LIPASE, AMYLASE in the last 168 hours. Recent Labs  Lab 08/09/18 1050  AMMONIA 56*    CBC: Recent Labs  Lab 08/05/18 0537 08/05/18 1448 08/06/18 0324 08/09/18 1049  WBC 9.1 8.4 9.1 9.3  NEUTROABS  --   --  7.1  --   HGB 7.4* 7.6* 8.5* 8.2*  HCT 24.5* 24.9* 27.6* 25.9*  MCV 95.3  95.0 94.2 91.8  PLT 487* 533* 595* 679*    Cardiac Enzymes: No results for input(s): CKTOTAL, CKMB, CKMBINDEX, TROPONINI in the last 168 hours.  BNP (last 3 results) No results for input(s): BNP in the last 8760 hours.  ProBNP (last 3 results) No results for input(s): PROBNP in the last 8760 hours.  Radiological Exams: Dg Abd 1 View  Result Date: 08/08/2018 CLINICAL DATA:  Ileus. Central abdominal pain. EXAM: ABDOMEN - 1 VIEW COMPARISON:  08/07/2018 FINDINGS: Diffuse gaseous distension of the colon has not significantly changed. Gaseous small bowel distention has decreased. Contrast remains in the rectum. No gross intraperitoneal free air is identified on this supine study. Right upper quadrant abdominal surgical clips are noted. No acute osseous abnormality is seen. IMPRESSION: Persistent ileus pattern with decreased small bowel distension. Electronically Signed   By: Sebastian Ache M.D.   On: 08/08/2018 07:50   US Renal  Result Date: 08/08/2018 CLINICAL DATA:  Decreased urinary output. EXAM: RENAL / URINARY TRACT ULTRASOUND COMPLETE COMPARISON:  None. FINDINGS: Suboptimal assessment due to patient inability to change position and poor sonic windows. Right Kidney: Renal measurements: 11.4 by 5.3 by 5.3 = volume: 170 mL . Echogenicity within normal limits. No mass or hydronephrosis visualized. Left Kidney: Renal measurements: 12.3 by 7.0 by 6.5 cm = volume: 292 mL.  Echogenicity within normal limits. No mass or hydronephrosis visualized. The lower pole the left kidney was poorly seen due to difficult sonic windows. Bladder: Appears normal for degree of bladder distention. A Foley catheter is present. Other: Perihepatic and pelvic ascites. IMPRESSION: 1. Ascites. 2. No specific renal or bladder abnormality is identified, but sensitivity is mildly reduced due to poor sonic windows causing difficulties visualizing parts of the kidneys including the left kidney lower pole. Electronically Signed   By:  Gaylyn RongWalter  Liebkemann M.D.   On: 08/08/2018 17:58   Dg Abd Portable 1v  Result Date: 08/09/2018 CLINICAL DATA:  48 year old female with sepsis, possible ileus. EXAM: PORTABLE ABDOMEN - 1 VIEW COMPARISON:  08/07/2018 and earlier. FINDINGS: Portable AP supine view at 0607 hours. Gas-filled small and large bowel loops throughout the abdomen with stable caliber at the upper limits of normal since 08/06/2018. No pneumoperitoneum evident on this supine view. Stable cholecystectomy clips. Negative visible osseous structures. IMPRESSION: Stable gas-filled small and large bowel loops throughout the abdomen since 08/06/2018 compatible with ileus. Electronically Signed   By: Odessa FlemingH  Hall M.D.   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 Acute on chronic respiratory failure with hypoxia continue weaning per protocol.  Continue supportive measures and pulmonary toilet.  Currently on aerosol trach collar 40% O2. 2. Aspiration pneumonia treated continue present management 3. Multifocal anoxic motor neuropathy unclear etiology continue supportive care 4. Sepsis with bacteremia treated hemodynamically stable 5. Chronic diastolic heart failure monitor fluid status and diurese as tolerated   I have personally seen and evaluated the patient, evaluated laboratory and imaging results, formulated the assessment and plan and placed orders. The Patient requires high complexity decision making for assessment and support.  Case was discussed on Rounds with the Respiratory Therapy Staff  Yevonne PaxSaadat A , MD [facility] Pulmonary Critical Care Medicine Sleep Medicine

## 2018-08-10 ENCOUNTER — Other Ambulatory Visit (HOSPITAL_COMMUNITY): Payer: Medicaid Other

## 2018-08-10 DIAGNOSIS — J9621 Acute and chronic respiratory failure with hypoxia: Secondary | ICD-10-CM | POA: Diagnosis not present

## 2018-08-10 DIAGNOSIS — J690 Pneumonitis due to inhalation of food and vomit: Secondary | ICD-10-CM | POA: Diagnosis not present

## 2018-08-10 DIAGNOSIS — A419 Sepsis, unspecified organism: Secondary | ICD-10-CM | POA: Diagnosis not present

## 2018-08-10 DIAGNOSIS — I5032 Chronic diastolic (congestive) heart failure: Secondary | ICD-10-CM | POA: Diagnosis not present

## 2018-08-10 LAB — BLOOD GAS, ARTERIAL
Acid-Base Excess: 12.5 mmol/L — ABNORMAL HIGH (ref 0.0–2.0)
Bicarbonate: 36.1 mmol/L — ABNORMAL HIGH (ref 20.0–28.0)
FIO2: 30
O2 Saturation: 97.2 %
PEEP: 5 cmH2O
Patient temperature: 98.6
Pressure control: 20 cmH2O
RATE: 12 resp/min
pCO2 arterial: 41.2 mmHg (ref 32.0–48.0)
pH, Arterial: 7.551 — ABNORMAL HIGH (ref 7.350–7.450)
pO2, Arterial: 72.3 mmHg — ABNORMAL LOW (ref 83.0–108.0)

## 2018-08-10 LAB — BASIC METABOLIC PANEL
Anion gap: 9 (ref 5–15)
BUN: 9 mg/dL (ref 6–20)
CO2: 31 mmol/L (ref 22–32)
Calcium: 8 mg/dL — ABNORMAL LOW (ref 8.9–10.3)
Chloride: 99 mmol/L (ref 98–111)
Creatinine, Ser: <0.3 mg/dL — ABNORMAL LOW (ref 0.44–1.00)
Glucose, Bld: 87 mg/dL (ref 70–99)
Potassium: 3 mmol/L — ABNORMAL LOW (ref 3.5–5.1)
Sodium: 139 mmol/L (ref 135–145)

## 2018-08-10 LAB — CBC
HCT: 23.3 % — ABNORMAL LOW (ref 36.0–46.0)
Hemoglobin: 7.3 g/dL — ABNORMAL LOW (ref 12.0–15.0)
MCH: 28.7 pg (ref 26.0–34.0)
MCHC: 31.3 g/dL (ref 30.0–36.0)
MCV: 91.7 fL (ref 80.0–100.0)
Platelets: 499 10*3/uL — ABNORMAL HIGH (ref 150–400)
RBC: 2.54 MIL/uL — ABNORMAL LOW (ref 3.87–5.11)
RDW: 15.3 % (ref 11.5–15.5)
WBC: 6.2 10*3/uL (ref 4.0–10.5)
nRBC: 0 % (ref 0.0–0.2)

## 2018-08-10 LAB — PROCALCITONIN: Procalcitonin: <0.1 ng/mL

## 2018-08-10 MED ORDER — GENERIC EXTERNAL MEDICATION
Status: DC
Start: ? — End: 2018-08-10

## 2018-08-10 NOTE — Progress Notes (Addendum)
[facility] [place]   PULMONARY CRITICAL CARE SERVICE  PROGRESS NOTE  Date of Service: 08/10/2018  Tiffany Applebaumngela A Foulk  ZOX:[id number]  DOB: 1970/05/27   DOA: 08/04/2018  Referring Physician: Carron CurieAli Hijazi, MD  HPI: Tiffany Atkinson is a 48 y.o. female seen for follow up of Acute on Chronic Respiratory Failure.  Patient remains on aerosol trach collar 35% FiO2 during the day and rest on the ventilator at night.  Doing well with no distress at this time.  Medications: Reviewed on Rounds  Physical Exam:  Vitals: Pulse 98 respirations 30 BP 130/75 O2 sat 99% temp 97.0  Ventilator Settings ATC 35%  . General: Comfortable at this time . Eyes: Grossly normal lids, irises & conjunctiva . ENT: grossly tongue is normal . Neck: no obvious mass . Cardiovascular: S1 S2 normal no gallop . Respiratory: Coarse breath sounds . Abdomen: soft . Skin: no rash seen on limited exam . Musculoskeletal: not rigid . Psychiatric:unable to assess . Neurologic: no seizure no involuntary movements         Lab Data:   Basic Metabolic Panel: Recent Labs  Lab 08/05/18 0537 08/09/18 0602 08/09/18 1049 08/10/18 0753  NA 145 142 140 139  K 3.2* 2.3* 3.8 3.0*  CL 98 97* 100 99  CO2 36* 36* 32 31  GLUCOSE 100* 111* 99 87  BUN 24* 10 12 9   CREATININE <0.30* <0.30* <0.30* <0.30*  CALCIUM 8.7* 8.1* 8.1* 8.0*    ABG: Recent Labs  Lab 08/06/18 0605 08/09/18 0830 08/10/18 0355  PHART 7.429 7.383 7.551*  PCO2ART 53.9* 62.6* 41.2  PO2ART 98.9 65.6* 72.3*  HCO3 35.1* 36.5* 36.1*  O2SAT 98.2 92.0 97.2    Liver Function Tests: Recent Labs  Lab 08/09/18 1049  AST 33  ALT 24  ALKPHOS 63  BILITOT 0.5  PROT 5.0*  ALBUMIN 2.1*   No results for input(s): LIPASE, AMYLASE in the last 168 hours. Recent Labs  Lab 08/09/18 1050  AMMONIA 56*    CBC: Recent Labs  Lab 08/05/18 0537 08/05/18 1448 08/06/18 0324 08/09/18 1049 08/10/18 0753  WBC 9.1 8.4  9.1 9.3 6.2  NEUTROABS  --   --  7.1  --   --   HGB 7.4* 7.6* 8.5* 8.2* 7.3*  HCT 24.5* 24.9* 27.6* 25.9* 23.3*  MCV 95.3 95.0 94.2 91.8 91.7  PLT 487* 533* 595* 679* 499*    Cardiac Enzymes: No results for input(s): CKTOTAL, CKMB, CKMBINDEX, TROPONINI in the last 168 hours.  BNP (last 3 results) No results for input(s): BNP in the last 8760 hours.  ProBNP (last 3 results) No results for input(s): PROBNP in the last 8760 hours.  Radiological Exams: Ct Abdomen Pelvis Wo Contrast  Result Date: 08/09/2018 CLINICAL DATA:  Abdominal distension in a patient with ascites. EXAM: CT ABDOMEN AND PELVIS WITHOUT CONTRAST TECHNIQUE: Multidetector CT imaging of the abdomen and pelvis was performed following the standard protocol without IV contrast. COMPARISON:  Plain films of the abdomen since 08/06/2018. FINDINGS: Lower chest: There is cardiomegaly. The patient has dense bilateral lower lobe airspace disease with air bronchograms present. Small pleural effusions are identified. Hepatobiliary: No focal liver abnormality is seen. Status post cholecystectomy. No biliary dilatation. Pancreas: Unremarkable. No pancreatic ductal dilatation or surrounding inflammatory changes. Spleen: Normal in size without focal abnormality. Adrenals/Urinary Tract: Adrenal glands are unremarkable. Kidneys are normal, without renal calculi, focal lesion, or hydronephrosis. Bladder is unremarkable. Foley catheter noted. Stomach/Bowel: There is no evidence of  bowel obstruction. Moderate gaseous distention of the colon is noted. There is contrast material in the descending colon through the rectum. No free air, pneumatosis or portal venous gas is identified. PEG tube is in place. Vascular/Lymphatic: No significant vascular findings are present. No enlarged abdominal or pelvic lymph nodes. Reproductive: Status post hysterectomy. No adnexal masses. Other: There is a small volume of upper abdominal ascites. Body wall edema is noted.  Musculoskeletal: No focal lesion. Convex left lumbar curvature may be positional. IMPRESSION: Small volume of upper abdominal ascites. Volume does not appear large enough for paracentesis. Moderate gaseous distention of the colon. Negative for bowel obstruction. Dense bilateral lower lobe airspace disease with associated small pleural effusions most consistent with pneumonia. Cardiomegaly. Electronically Signed   By: Drusilla Kanner M.D.   On: 08/09/2018 17:36   Dg Chest Port 1 View  Result Date: 08/09/2018 CLINICAL DATA:  Status post PICC placement. EXAM: PORTABLE CHEST 1 VIEW COMPARISON:  Abdominal CT 08/09/2018 FINDINGS: Tracheostomy tube overlies the airway. A right PICC terminates over the lower SVC. The cardiac silhouette is borderline enlarged. Lung volumes are low with basilar airspace consolidation and small pleural effusions bilaterally. No pneumothorax is identified. Moderate gaseous distension of the colon is partially visualized, more fully evaluated on earlier CT. No acute osseous abnormality is identified. IMPRESSION: 1. Right PICC terminates over the lower SVC. 2. Low lung volumes with bibasilar airspace consolidation and small pleural effusions. Electronically Signed   By: Sebastian Ache M.D.   On: 08/09/2018 21:45   Dg Abd Portable 1v  Result Date: 08/10/2018 CLINICAL DATA:  Ileus EXAM: PORTABLE ABDOMEN - 1 VIEW COMPARISON:  08/09/2018 FINDINGS: Scattered large and small bowel gas is noted. Degree of distention of the colon has improved slightly in the interval. No true obstructive changes are seen. No free air is noted. No bony abnormality is seen. Gastrostomy catheter is noted on the left. IMPRESSION: Gaseous distension of large and small bowel although slightly improved when compared with the prior exam. Electronically Signed   By: Alcide Clever M.D.   On: 08/10/2018 07:14   Dg Abd Portable 1v  Result Date: 08/09/2018 CLINICAL DATA:  48 year old female with sepsis, possible ileus.  EXAM: PORTABLE ABDOMEN - 1 VIEW COMPARISON:  08/07/2018 and earlier. FINDINGS: Portable AP supine view at 0607 hours. Gas-filled small and large bowel loops throughout the abdomen with stable caliber at the upper limits of normal since 08/06/2018. No pneumoperitoneum evident on this supine view. Stable cholecystectomy clips. Negative visible osseous structures. IMPRESSION: Stable gas-filled small and large bowel loops throughout the abdomen since 08/06/2018 compatible with ileus. Electronically Signed   By: Odessa Fleming M.D.   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 Acute on chronic respiratory failure with hypoxia continue weaning per protocol.  Continue pulmonary toilet and secretion management.  Continue with aerosol trach collar during the day of the night. 2. Aspiration pneumonia treated continue present management 3. Multifocal anoxic motor neuropathy unclear etiology continue supportive care 4. Sepsis with bacteremia treated hemodynamically stable 5. Chronic diastolic heart failure monitor fluid status and diurese as tolerated   I have personally seen and evaluated the patient, evaluated laboratory and imaging results, formulated the assessment and plan and placed orders. The  Patient requires high complexity decision making for assessment and support.  Case was discussed on Rounds with the Respiratory Therapy Staff  Allyne Gee, MD [facility] Pulmonary Critical Care Medicine Sleep Medicine

## 2018-08-11 ENCOUNTER — Other Ambulatory Visit (HOSPITAL_COMMUNITY): Payer: Medicaid Other

## 2018-08-11 DIAGNOSIS — A419 Sepsis, unspecified organism: Secondary | ICD-10-CM | POA: Diagnosis not present

## 2018-08-11 DIAGNOSIS — J690 Pneumonitis due to inhalation of food and vomit: Secondary | ICD-10-CM | POA: Diagnosis not present

## 2018-08-11 DIAGNOSIS — I5032 Chronic diastolic (congestive) heart failure: Secondary | ICD-10-CM | POA: Diagnosis not present

## 2018-08-11 DIAGNOSIS — J9621 Acute and chronic respiratory failure with hypoxia: Secondary | ICD-10-CM | POA: Diagnosis not present

## 2018-08-11 LAB — PROCALCITONIN: Procalcitonin: <0.1 ng/mL

## 2018-08-11 LAB — POTASSIUM: Potassium: 3.8 mmol/L (ref 3.5–5.1)

## 2018-08-11 MED ORDER — Medication
Status: DC
Start: ? — End: 2018-08-11

## 2018-08-11 NOTE — Progress Notes (Addendum)
Pulmonary Critical Care Medicine [facility] [place]   PULMONARY CRITICAL CARE SERVICE  PROGRESS NOTE  Date of Service: 08/11/2018  Tiffany Atkinson  ZOX:[id number]  DOB: [birth year]   DOA: 08/04/2018  Referring Physician: Carron Curie, MD  HPI: Tiffany Atkinson is a 48 y.o. female seen for follow up of Acute on Chronic Respiratory Failure.  Patient continues on aerosol trach collar 28% during the day resting on the ventilator at night.  FiO2 was dropped from 35 to 28% and is doing well today with good saturations.  Medications: Reviewed on Rounds  Physical Exam:  Vitals: Pulse 92 respiration 17 BP 157/89 O2 sat 96% temp 97.2  Ventilator Settings aerosol trach collar 28% during the day at night ventilator mode AC PC rate of 12 inspiratory pressure 20 PEEP of 5 FiO2 28%  . General: Comfortable at this time . Eyes: Grossly normal lids, irises & conjunctiva . ENT: grossly tongue is normal . Neck: no obvious mass . Cardiovascular: S1 S2 normal no gallop . Respiratory: Coarse breath sounds . Abdomen: soft . Skin: no rash seen on limited exam . Musculoskeletal: not rigid . Psychiatric:unable to assess . Neurologic: no seizure no involuntary movements         Lab Data:   Basic Metabolic Panel: Recent Labs  Lab 08/05/18 0537 08/09/18 0602 08/09/18 1049 08/10/18 0753 08/11/18 0252  NA 145 142 140 139  --   K 3.2* 2.3* 3.8 3.0* 3.8  CL 98 97* 100 99  --   CO2 36* 36* 32 31  --   GLUCOSE 100* 111* 99 87  --   BUN 24* --   CREATININE <0.30* <0.30* <0.30* <0.30*  --   CALCIUM 8.7* 8.1* 8.1* 8.0*  --     ABG: Recent Labs  Lab 08/06/18 0605 08/09/18 0830 08/10/18 0355  PHART 7.429 7.383 7.551*  PCO2ART 53.9* 62.6* 41.2  PO2ART 98.9 65.6* 72.3*  HCO3 35.1* 36.5* 36.1*  O2SAT 98.2 92.0 97.2    Liver Function Tests: Recent Labs  Lab 08/09/18 1049  AST 33  ALT 24  ALKPHOS 63  BILITOT 0.5  PROT 5.0*  ALBUMIN 2.1*   No results for input(s):  LIPASE, AMYLASE in the last 168 hours. Recent Labs  Lab 08/09/18 1050  AMMONIA 56*    CBC: Recent Labs  Lab 08/05/18 0537 08/05/18 1448 08/06/18 0324 08/09/18 1049 08/10/18 0753  WBC 9.1 8.4 9.1 9.3 6.2  NEUTROABS  --   --  7.1  --   --   HGB 7.4* 7.6* 8.5* 8.2* 7.3*  HCT 24.5* 24.9* 27.6* 25.9* 23.3*  MCV 95.3 95.0 94.2 91.8 91.7  PLT 487* 533* 595* 679* 499*    Cardiac Enzymes: No results for input(s): CKTOTAL, CKMB, CKMBINDEX, TROPONINI in the last 168 hours.  BNP (last 3 results) No results for input(s): BNP in the last 8760 hours.  ProBNP (last 3 results) No results for input(s): PROBNP in the last 8760 hours.  Radiological Exams: Ct Abdomen Pelvis Wo Contrast  Result Date: 08/09/2018 CLINICAL DATA:  Abdominal distension in a patient with ascites. EXAM: CT ABDOMEN AND PELVIS WITHOUT CONTRAST TECHNIQUE: Multidetector CT imaging of the abdomen and pelvis was performed following the standard protocol without IV contrast. COMPARISON:  Plain films of the abdomen since 08/06/2018. FINDINGS: Lower chest: There is cardiomegaly. The patient has dense bilateral lower lobe airspace disease with air bronchograms present. Small pleural effusions are identified. Hepatobiliary: No focal liver abnormality is seen.  Status post cholecystectomy. No biliary dilatation. Pancreas: Unremarkable. No pancreatic ductal dilatation or surrounding inflammatory changes. Spleen: Normal in size without focal abnormality. Adrenals/Urinary Tract: Adrenal glands are unremarkable. Kidneys are normal, without renal calculi, focal lesion, or hydronephrosis. Bladder is unremarkable. Foley catheter noted. Stomach/Bowel: There is no evidence of bowel obstruction. Moderate gaseous distention of the colon is noted. There is contrast material in the descending colon through the rectum. No free air, pneumatosis or portal venous gas is identified. PEG tube is in place. Vascular/Lymphatic: No significant vascular findings  are present. No enlarged abdominal or pelvic lymph nodes. Reproductive: Status post hysterectomy. No adnexal masses. Other: There is a small volume of upper abdominal ascites. Body wall edema is noted. Musculoskeletal: No focal lesion. Convex left lumbar curvature may be positional. IMPRESSION: Small volume of upper abdominal ascites. Volume does not appear large enough for paracentesis. Moderate gaseous distention of the colon. Negative for bowel obstruction. Dense bilateral lower lobe airspace disease with associated small pleural effusions most consistent with pneumonia. Cardiomegaly. Electronically Signed   By: Drusilla Kannerhomas  Dalessio M.D.   On: 08/09/2018 17:36   Dg Chest Port 1 View  Result Date: 08/09/2018 CLINICAL DATA:  Status post PICC placement. EXAM: PORTABLE CHEST 1 VIEW COMPARISON:  Abdominal CT 08/09/2018 FINDINGS: Tracheostomy tube overlies the airway. A right PICC terminates over the lower SVC. The cardiac silhouette is borderline enlarged. Lung volumes are low with basilar airspace consolidation and small pleural effusions bilaterally. No pneumothorax is identified. Moderate gaseous distension of the colon is partially visualized, more fully evaluated on earlier CT. No acute osseous abnormality is identified. IMPRESSION: 1. Right PICC terminates over the lower SVC. 2. Low lung volumes with bibasilar airspace consolidation and small pleural effusions. Electronically Signed   By: Sebastian AcheAllen  Grady M.D.   On: 08/09/2018 21:45   Dg Abd Portable 1v  Result Date: 08/11/2018 CLINICAL DATA:  Ileus EXAM: PORTABLE ABDOMEN - 1 VIEW COMPARISON:  08/11/2018 FINDINGS: Gas in mildly dilated large and small bowel with interval improvement. Surgical clips right upper quadrant. Gastrostomy tube better seen on the prior study. IMPRESSION: Interval improvement adynamic ileus compared with earlier today. Electronically Signed   By: Marlan Palauharles  Clark M.D.   On: 08/11/2018 15:13   Dg Abd Portable 1v  Result Date:  08/11/2018 CLINICAL DATA:  48 year old female with sepsis and ileus. EXAM: PORTABLE ABDOMEN - 1 VIEW COMPARISON:  08/10/2018 and earlier. FINDINGS: Portable AP supine view at 0644 hours. A rod like object projects over the spine and is new, presumably external. Continued gas-filled small and large bowel throughout the abdomen. Percutaneous gastrostomy tube partially visible. Gas pattern has not significantly changed since 08/09/2018. No pneumoperitoneum is evident on this supine view. Stable cholecystectomy clips. Stable visualized osseous structures. IMPRESSION: Stable ileus bowel gas pattern since 08/09/2018. Electronically Signed   By: Odessa FlemingH  Hall M.D.   On: 08/11/2018 07:27   Dg Abd Portable 1v  Result Date: 08/10/2018 CLINICAL DATA:  Ileus EXAM: PORTABLE ABDOMEN - 1 VIEW COMPARISON:  08/09/2018 FINDINGS: Scattered large and small bowel gas is noted. Degree of distention of the colon has improved slightly in the interval. No true obstructive changes are seen. No free air is noted. No bony abnormality is seen. Gastrostomy catheter is noted on the left. IMPRESSION: Gaseous distension of large and small bowel although slightly improved when compared with the prior exam. Electronically Signed   By: Alcide CleverMark  Lukens M.D.   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 Acute on chronic respiratory failure with hypoxia continue weaning per protocol.  Patient's FiO2 dropped from 35 to 28% today on aerosol trach collar doing well with good saturations.  Continue to rest on the vent at night continue pulmonary toilet and secretion management 2. Aspiration pneumonia treated continue present management 3. Multifocal anoxic motor neuropathy unclear etiology continue  supportive care 4. Sepsis with bacteremia treated hemodynamically stable 5. Chronic diastolic heart failure monitor fluid status and diurese as tolerated   I have personally seen and evaluated the patient, evaluated laboratory and imaging results, formulated the assessment and plan and placed orders. The Patient requires high complexity decision making for assessment and support.  Case was discussed on Rounds with the Respiratory Therapy Staff  Yevonne Pax, MD [facility] Pulmonary Critical Care

## 2018-08-12 DIAGNOSIS — A419 Sepsis, unspecified organism: Secondary | ICD-10-CM | POA: Diagnosis not present

## 2018-08-12 DIAGNOSIS — J690 Pneumonitis due to inhalation of food and vomit: Secondary | ICD-10-CM | POA: Diagnosis not present

## 2018-08-12 DIAGNOSIS — I5032 Chronic diastolic (congestive) heart failure: Secondary | ICD-10-CM | POA: Diagnosis not present

## 2018-08-12 DIAGNOSIS — J9621 Acute and chronic respiratory failure with hypoxia: Secondary | ICD-10-CM | POA: Diagnosis not present

## 2018-08-12 NOTE — Progress Notes (Signed)
[facility]   PULMONARY CRITICAL CARE SERVICE  PROGRESS NOTE  Date of Service: 08/12/2018  Tiffany Applebaumngela A Tayag  BJY:[id number]  DOB: [birth year]   DOA: 08/04/2018  Referring Physician: Carron CurieAli Hijazi, MD  HPI: Tiffany Atkinson is a 48 y.o. female seen for follow up of Acute on Chronic Respiratory Failure.  She is on T collar during the daytime doing ventilator at nighttime right now is on 28% FiO2 good saturations are noted  Medications: Reviewed on Rounds  Physical Exam:  Vitals: Temperature 98.2 pulse 84 respiratory 19 blood pressure 150/88 saturations 100%  Ventilator Settings off ventilator on T collar right now  . General: Comfortable at this time . Eyes: Grossly normal lids, irises & conjunctiva . ENT: grossly tongue is normal . Neck: no obvious mass . Cardiovascular: S1 S2 normal no gallop . Respiratory: Coarse breath sounds with a few rhonchi . Abdomen: soft . Skin: no rash seen on limited exam . Musculoskeletal: not rigid . Psychiatric:unable to assess . Neurologic: no seizure no involuntary movements         Lab Data:   Basic Metabolic Panel: Recent Labs  Lab 08/09/18 0602 08/09/18 1049 08/10/18 0753 08/11/18 0252  NA 142 140 139  --   K 2.3* 3.8 3.0* 3.8  CL 97* 100 99  --   CO2 36* 32 31  --   GLUCOSE 111* 99 87  --   BUN 10 12 9   --   CREATININE <0.30* <0.30* <0.30*  --   CALCIUM 8.1* 8.1* 8.0*  --     ABG: Recent Labs  Lab 08/06/18 0605 08/09/18 0830 08/10/18 0355  PHART 7.429 7.383 7.551*  PCO2ART 53.9* 62.6* 41.2  PO2ART 98.9 65.6* 72.3*  HCO3 35.1* 36.5* 36.1*  O2SAT 98.2 92.0 97.2    Liver Function Tests: Recent Labs  Lab 08/09/18 1049  AST 33  ALT 24  ALKPHOS 63  BILITOT 0.5  PROT 5.0*  ALBUMIN 2.1*   No results for input(s): LIPASE, AMYLASE in the last 168 hours. Recent Labs  Lab 08/09/18 1050  AMMONIA 56*    CBC: Recent Labs  Lab 08/06/18 0324 08/09/18 1049  08/10/18 0753  WBC 9.1 9.3 6.2  NEUTROABS 7.1  --   --   HGB 8.5* 8.2* 7.3*  HCT 27.6* 25.9* 23.3*  MCV 94.2 91.8 91.7  PLT 595* 679* 499*    Cardiac Enzymes: No results for input(s): CKTOTAL, CKMB, CKMBINDEX, TROPONINI in the last 168 hours.  BNP (last 3 results) No results for input(s): BNP in the last 8760 hours.  ProBNP (last 3 results) No results for input(s): PROBNP in the last 8760 hours.  Radiological Exams: Dg Abd Portable 1v  Result Date: 08/11/2018 CLINICAL DATA:  Ileus EXAM: PORTABLE ABDOMEN - 1 VIEW COMPARISON:  08/11/2018 FINDINGS: Gas in mildly dilated large and small bowel with interval improvement. Surgical clips right upper quadrant. Gastrostomy tube better seen on the prior study. IMPRESSION: Interval improvement adynamic ileus compared with earlier today. Electronically Signed   By: Marlan Palauharles  Clark M.D.   On: 08/11/2018 15:13   Dg Abd Portable 1v  Result Date: 08/11/2018 CLINICAL DATA:  48 year old female with sepsis and ileus. EXAM: PORTABLE ABDOMEN - 1 VIEW COMPARISON:  08/10/2018 and earlier. FINDINGS: Portable AP supine view at 0644 hours. A rod like object projects over the spine and is new, presumably external. Continued gas-filled small and large bowel throughout the abdomen. Percutaneous gastrostomy tube partially visible. Gas pattern has  not significantly changed since 08/09/2018. No pneumoperitoneum is evident on this supine view. Stable cholecystectomy clips. Stable visualized osseous structures. IMPRESSION: Stable ileus bowel gas pattern since 08/09/2018. Electronically Signed   By: Odessa Fleming M.D.   On: 08/11/2018 07:27    Assessment/Plan Active Problems:   Acute on chronic respiratory failure with hypoxia (HCC)   Aspiration pneumonia due to gastric secretions (HCC)   MMN (multifocal motor neuropathy) (HCC)   Severe sepsis (HCC)   Diastolic dysfunction with chronic heart failure (HCC)   1. Acute on chronic respiratory failure with hypoxia we will  continue with T collar trials titrate oxygen as tolerated continue pulmonary toilet 2. Aspiration pneumonia secretions are about the same continue pulmonary toilet. 3. Multifocal motor neuropathy supportive care 4. Severe sepsis hemodynamically stable 5. Diastolic dysfunction monitor fluid status   I have personally seen and evaluated the patient, evaluated laboratory and imaging results, formulated the assessment and plan and placed orders. The Patient requires high complexity decision making for assessment and support.  Case was discussed on Rounds with the Respiratory Therapy Staff  Yevonne Pax, MD [facility] Pulmonary Critical Care Medicine Sleep Medicine

## 2018-08-13 ENCOUNTER — Other Ambulatory Visit (HOSPITAL_COMMUNITY): Payer: Medicaid Other

## 2018-08-13 DIAGNOSIS — A419 Sepsis, unspecified organism: Secondary | ICD-10-CM | POA: Diagnosis not present

## 2018-08-13 DIAGNOSIS — I5032 Chronic diastolic (congestive) heart failure: Secondary | ICD-10-CM | POA: Diagnosis not present

## 2018-08-13 DIAGNOSIS — J9621 Acute and chronic respiratory failure with hypoxia: Secondary | ICD-10-CM | POA: Diagnosis not present

## 2018-08-13 DIAGNOSIS — J690 Pneumonitis due to inhalation of food and vomit: Secondary | ICD-10-CM | POA: Diagnosis not present

## 2018-08-13 MED ORDER — GENERIC EXTERNAL MEDICATION
Status: DC
Start: ? — End: 2018-08-13

## 2018-08-13 NOTE — Progress Notes (Addendum)
[facility]   PULMONARY CRITICAL CARE SERVICE  PROGRESS NOTE  Date of Service: 08/13/2018  Tiffany Applebaumngela A Stander  ZOX:[id number]  DOB: 06/10/1970   DOA: 08/04/2018  Referring Physician: Carron CurieAli Hijazi, MD  HPI: Tiffany Atkinson is a 48 y.o. female seen for follow up of Acute on Chronic Respiratory Failure.  Patient remains on aerosol trach collar 28% FiO2 during the day and resting on the ventilator at night.  Currently stable with no distress.  Medications: Reviewed on Rounds  Physical Exam:  Vitals: Pulse 79 respirations 16 BP 175/90 O2 sat 99% temp 98.5  Ventilator Settings during the day aerosol trach collar 28% at night on ventilator mode AC PC rate of 12 expiratory pressure 20 PEEP of 5 FiO2 28%  . General: Comfortable at this time . Eyes: Grossly normal lids, irises & conjunctiva . ENT: grossly tongue is normal . Neck: no obvious mass . Cardiovascular: S1 S2 normal no gallop . Respiratory: Coarse breath sounds . Abdomen: soft . Skin: no rash seen on limited exam . Musculoskeletal: not rigid . Psychiatric:unable to assess . Neurologic: no seizure no involuntary movements         Lab Data:   Basic Metabolic Panel: Recent Labs  Lab 08/09/18 0602 08/09/18 1049 08/10/18 0753 08/11/18 0252  NA 142 140 139  --   K 2.3* 3.8 3.0* 3.8  CL 97* 100 99  --   CO2 36* 32 31  --   GLUCOSE 111* 99 87  --   BUN 10 12 9   --   CREATININE <0.30* <0.30* <0.30*  --   CALCIUM 8.1* 8.1* 8.0*  --     ABG: Recent Labs  Lab 08/09/18 0830 08/10/18 0355  PHART 7.383 7.551*  PCO2ART 62.6* 41.2  PO2ART 65.6* 72.3*  HCO3 36.5* 36.1*  O2SAT 92.0 97.2    Liver Function Tests: Recent Labs  Lab 08/09/18 1049  AST 33  ALT 24  ALKPHOS 63  BILITOT 0.5  PROT 5.0*  ALBUMIN 2.1*   No results for input(s): LIPASE, AMYLASE in the last 168 hours. Recent Labs  Lab 08/09/18 1050  AMMONIA 56*    CBC: Recent Labs  Lab 08/09/18 1049  08/10/18 0753  WBC 9.3 6.2  HGB 8.2* 7.3*  HCT 25.9* 23.3*  MCV 91.8 91.7  PLT 679* 499*    Cardiac Enzymes: No results for input(s): CKTOTAL, CKMB, CKMBINDEX, TROPONINI in the last 168 hours.  BNP (last 3 results) No results for input(s): BNP in the last 8760 hours.  ProBNP (last 3 results) No results for input(s): PROBNP in the last 8760 hours.  Radiological Exams: Dg Abd Portable 1v  Result Date: 08/13/2018 CLINICAL DATA:  Ileus. EXAM: PORTABLE ABDOMEN - 1 VIEW COMPARISON:  08/11/2018 FINDINGS: Moderately distended loops of small and large bowel, without evidence of proximal obstruction consistent with ileus. Colonic distention is increasing, notably the cecum. Rectal gas is observed. IMPRESSION: Worsening ileus, with increasing colonic dilatation. Electronically Signed   By: Elsie StainJohn T Curnes M.D.   On: 08/13/2018 08:11    Assessment/Plan Active Problems:   Acute on chronic respiratory failure with hypoxia (HCC)   Aspiration pneumonia due to gastric secretions (HCC)   MMN (multifocal motor neuropathy) (HCC)   Severe sepsis (HCC)   Diastolic dysfunction with chronic heart failure (HCC)   1. Acute on chronic respiratory failure with hypoxia continue with T collar trials and titrate oxygen as tolerated.  Continue aggressive pulmonary toilet secretion management 2.  Aspiration pneumonia secretions are about the same continue pulmonary toilet 3. Multifocal motor neuropathy continue supportive measures 4. Severe sepsis hemodynamically stable 5. Diastolic dysfunction monitor fluid status   I have personally seen and evaluated the patient, evaluated laboratory and imaging results, formulated the assessment and plan and placed orders. The Patient requires high complexity decision making for assessment and support.  Case was discussed on Rounds with the Respiratory Therapy Staff  Yevonne Pax, MD [facility] Pulmonary Critical Care Medicine Sleep Medicine

## 2018-08-14 DIAGNOSIS — R6520 Severe sepsis without septic shock: Secondary | ICD-10-CM

## 2018-08-14 DIAGNOSIS — I5032 Chronic diastolic (congestive) heart failure: Secondary | ICD-10-CM

## 2018-08-14 DIAGNOSIS — A419 Sepsis, unspecified organism: Secondary | ICD-10-CM

## 2018-08-14 DIAGNOSIS — J9621 Acute and chronic respiratory failure with hypoxia: Secondary | ICD-10-CM | POA: Diagnosis not present

## 2018-08-14 DIAGNOSIS — J690 Pneumonitis due to inhalation of food and vomit: Secondary | ICD-10-CM

## 2018-08-14 LAB — TROPONIN I
Troponin I: <0.03 ng/mL (ref <0.03)
Troponin I: <0.03 ng/mL (ref <0.03)

## 2018-08-14 NOTE — Progress Notes (Addendum)
Pulmonary Critical Care Medicine [facility] [place]   PULMONARY CRITICAL CARE SERVICE  PROGRESS NOTE  Date of Service: 08/14/2018  Tiffany Atkinson  [id number]:[id number]  DOB: [birth year]   DOA: 08/04/2018  Referring Physician: Carron Curie, MD  HPI: Tiffany Atkinson is a 48 y.o. female seen for follow up of Acute on Chronic Respiratory Failure.  Patient remains on aerosol trach collar 28% FiO2 during the day and resting on the vent at night.  Medications: Reviewed on Rounds  Physical Exam:  Vitals: Pulse 77 pressure 13 BP 157/84 O2 sat 100% temp 97.4  Ventilator Settings ATC 28%  . General: Comfortable at this time . Eyes: Grossly normal lids, irises & conjunctiva . ENT: grossly tongue is normal . Neck: no obvious mass . Cardiovascular: S1 S2 normal no gallop . Respiratory: Coarse breath sounds . Abdomen: soft . Skin: no rash seen on limited exam . Musculoskeletal: not rigid . Psychiatric:unable to assess . Neurologic: no seizure no involuntary movements         Lab Data:   Basic Metabolic Panel: Recent Labs  Lab 08/09/18 0602 08/09/18 1049 08/10/18 0753 08/11/18 0252  NA 142 140 139  --   K 2.3* 3.8 3.0* 3.8  CL 97* 100 99  --   CO2 36* 32 31  --   GLUCOSE 111* 99 87  --   BUN 10 12 9   --   CREATININE <0.30* <0.30* <0.30*  --   CALCIUM 8.1* 8.1* 8.0*  --     ABG: Recent Labs  Lab 08/09/18 0830 08/10/18 0355  PHART 7.383 7.551*  PCO2ART 62.6* 41.2  PO2ART 65.6* 72.3*  HCO3 36.5* 36.1*  O2SAT 92.0 97.2    Liver Function Tests: Recent Labs  Lab 08/09/18 1049  AST 33  ALT 24  ALKPHOS 63  BILITOT 0.5  PROT 5.0*  ALBUMIN 2.1*   No results for input(s): LIPASE, AMYLASE in the last 168 hours. Recent Labs  Lab 08/09/18 1050  AMMONIA 56*    CBC: Recent Labs  Lab 08/09/18 1049 08/10/18 0753  WBC 9.3 6.2  HGB 8.2* 7.3*  HCT 25.9* 23.3*  MCV 91.8 91.7  PLT 679* 499*    Cardiac Enzymes: No results for input(s): CKTOTAL, CKMB,  CKMBINDEX, TROPONINI in the last 168 hours.  BNP (last 3 results) No results for input(s): BNP in the last 8760 hours.  ProBNP (last 3 results) No results for input(s): PROBNP in the last 8760 hours.  Radiological Exams: Dg Abd Portable 1v  Result Date: 08/13/2018 CLINICAL DATA:  Ileus. EXAM: PORTABLE ABDOMEN - 1 VIEW COMPARISON:  08/11/2018 FINDINGS: Moderately distended loops of small and large bowel, without evidence of proximal obstruction consistent with ileus. Colonic distention is increasing, notably the cecum. Rectal gas is observed. IMPRESSION: Worsening ileus, with increasing colonic dilatation. Electronically Signed   By: Elsie Stain M.D.   On: 08/13/2018 08:11    Assessment/Plan Active Problems:   Acute on chronic respiratory failure with hypoxia (HCC)   Aspiration pneumonia due to gastric secretions (HCC)   MMN (multifocal motor neuropathy) (HCC)   Severe sepsis (HCC)   Diastolic dysfunction with chronic heart failure (HCC)   1. Acute on chronic respiratory failure with hypoxia continue with T collar trials and continue titrate oxygen as tolerated protocol.  Continue aggressive pulmonary toilet secretion management 2. Aspiration pneumonia continue pulmonary toilet and secretion 3. Stable 4. Multifocal motor neuropathy continue supportive measures 5. Severe sepsis hemodynamically stable 6. Diastolic dysfunction continue to monitor  fluid status   I have personally seen and evaluated the patient, evaluated laboratory and imaging results, formulated the assessment and plan and placed orders. The Patient requires high complexity decision making for assessment and support.  Case was discussed on Rounds with the Respiratory Therapy Staff  Yevonne PaxSaadat A Malikai Gut, MD [facility] Pulmonary Critical Care Medicine Sleep Medicine

## 2018-08-15 DIAGNOSIS — I5032 Chronic diastolic (congestive) heart failure: Secondary | ICD-10-CM | POA: Diagnosis not present

## 2018-08-15 DIAGNOSIS — A419 Sepsis, unspecified organism: Secondary | ICD-10-CM | POA: Diagnosis not present

## 2018-08-15 DIAGNOSIS — J9621 Acute and chronic respiratory failure with hypoxia: Secondary | ICD-10-CM | POA: Diagnosis not present

## 2018-08-15 DIAGNOSIS — J690 Pneumonitis due to inhalation of food and vomit: Secondary | ICD-10-CM | POA: Diagnosis not present

## 2018-08-15 LAB — TROPONIN I: Troponin I: <0.03 ng/mL (ref <0.03)

## 2018-08-15 NOTE — Progress Notes (Addendum)
Pulmonary Critical Care Medicine [facility] [place]   PULMONARY CRITICAL CARE SERVICE  PROGRESS NOTE  Date of Service: 08/15/2018  Tiffany Atkinson  HQI:[id number]  DOB: 10/26/1970   DOA: 08/04/2018  Referring Physician: Carron Curie, MD  HPI: Tiffany Atkinson is a 48 y.o. female seen for follow up of Acute on Chronic Respiratory Failure.  Patient continues on aerosol trach collar 28% during the day resting on ventilator at night.  Patient has had some increased cough and coarse breath sounds so Lasix has been given.  Medications: Reviewed on Rounds  Physical Exam:  Vitals: Pulse 83 respirations 18 BP 142/74 O2 sat 90% temp 97.6  Ventilator Settings aerosol trach collar 28%  . General: Comfortable at this time . Eyes: Grossly normal lids, irises & conjunctiva . ENT: grossly tongue is normal . Neck: no obvious mass . Cardiovascular: S1 S2 normal no gallop . Respiratory: Coarse breath sounds . Abdomen: soft . Skin: no rash seen on limited exam . Musculoskeletal: not rigid . Psychiatric:unable to assess . Neurologic: no seizure no involuntary movements         Lab Data:   Basic Metabolic Panel: Recent Labs  Lab 08/09/18 0602 08/09/18 1049 08/10/18 0753 08/11/18 0252  NA 142 140 139  --   K 2.3* 3.8 3.0* 3.8  CL 97* 100 99  --   CO2 36* 32 31  --   GLUCOSE 111* 99 87  --   BUN 10 12 9   --   CREATININE <0.30* <0.30* <0.30*  --   CALCIUM 8.1* 8.1* 8.0*  --     ABG: Recent Labs  Lab 08/09/18 0830 08/10/18 0355  PHART 7.383 7.551*  PCO2ART 62.6* 41.2  PO2ART 65.6* 72.3*  HCO3 36.5* 36.1*  O2SAT 92.0 97.2    Liver Function Tests: Recent Labs  Lab 08/09/18 1049  AST 33  ALT 24  ALKPHOS 63  BILITOT 0.5  PROT 5.0*  ALBUMIN 2.1*   No results for input(s): LIPASE, AMYLASE in the last 168 hours. Recent Labs  Lab 08/09/18 1050  AMMONIA 56*    CBC: Recent Labs  Lab 08/09/18 1049 08/10/18 0753  WBC 9.3 6.2  HGB 8.2* 7.3*  HCT 25.9*  23.3*  MCV 91.8 91.7  PLT 679* 499*    Cardiac Enzymes: Recent Labs  Lab 08/14/18 1542 08/14/18 2044 08/15/18 0319  TROPONINI <0.03 <0.03 <0.03    BNP (last 3 results) No results for input(s): BNP in the last 8760 hours.  ProBNP (last 3 results) No results for input(s): PROBNP in the last 8760 hours.  Radiological Exams: No results found.  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 Acute on chronic respiratory failure with hypoxia continue with trach collar during the day and resting on the vent at night.  Continue secretion management pulmonary toilet 2. Aspiration pneumonia continue to follow. 3. Multifocal motor neuropathy continue supportive measures 4. Severe sepsis hemodynamically stable 5. Diastolic dysfunction continue to monitor fluid status   I have personally seen and evaluated the patient, evaluated laboratory and imaging results, formulated the assessment and plan and placed orders. The Patient requires high complexity decision making for assessment and support.  Case was discussed on Rounds with the Respiratory Therapy Staff  Yevonne Pax, MD [facility]  Pulmonary Critical Care Medicine Sleep Medicine

## 2018-08-16 DIAGNOSIS — J690 Pneumonitis due to inhalation of food and vomit: Secondary | ICD-10-CM | POA: Diagnosis not present

## 2018-08-16 DIAGNOSIS — A419 Sepsis, unspecified organism: Secondary | ICD-10-CM | POA: Diagnosis not present

## 2018-08-16 DIAGNOSIS — J9621 Acute and chronic respiratory failure with hypoxia: Secondary | ICD-10-CM | POA: Diagnosis not present

## 2018-08-16 DIAGNOSIS — I5032 Chronic diastolic (congestive) heart failure: Secondary | ICD-10-CM | POA: Diagnosis not present

## 2018-08-16 NOTE — Progress Notes (Addendum)
Pulmonary Critical Care Medicine [facility] [place]   PULMONARY CRITICAL CARE SERVICE  PROGRESS NOTE  Date of Service: 08/16/2018  Tiffany Atkinson  CZY:[id number]  DOB: [birth year]   DOA: 08/04/2018  Referring Physician: Carron Curie, MD  HPI: Tiffany Atkinson is a 48 y.o. female seen for follow up of Acute on Chronic Respiratory Failure.  Patient is on aerosol trach collar 20% FiO2 during the day and resting on the ventilator at night.  Currently satting well no acute distress noted.  Medications: Reviewed on Rounds  Physical Exam:  Vitals: Pulse 90 respirations 22 BP 133/60 O2 sat 95% temp 97.0  Ventilator Settings ATC 28%  . General: Comfortable at this time . Eyes: Grossly normal lids, irises & conjunctiva . ENT: grossly tongue is normal . Neck: no obvious mass . Cardiovascular: S1 S2 normal no gallop . Respiratory: Coarse breath sounds . Abdomen: soft . Skin: no rash seen on limited exam . Musculoskeletal: not rigid . Psychiatric:unable to assess . Neurologic: no seizure no involuntary movements         Lab Data:   Basic Metabolic Panel: Recent Labs  Lab 08/10/18 0753 08/11/18 0252  NA 139  --   K 3.0* 3.8  CL 99  --   CO2 31  --   GLUCOSE 87  --   BUN 9  --   CREATININE <0.30*  --   CALCIUM 8.0*  --     ABG: Recent Labs  Lab 08/10/18 0355  PHART 7.551*  PCO2ART 41.2  PO2ART 72.3*  HCO3 36.1*  O2SAT 97.2    Liver Function Tests: No results for input(s): AST, ALT, ALKPHOS, BILITOT, PROT, ALBUMIN in the last 168 hours. No results for input(s): LIPASE, AMYLASE in the last 168 hours. No results for input(s): AMMONIA in the last 168 hours.  CBC: Recent Labs  Lab 08/10/18 0753  WBC 6.2  HGB 7.3*  HCT 23.3*  MCV 91.7  PLT 499*    Cardiac Enzymes: Recent Labs  Lab 08/14/18 1542 08/14/18 2044 08/15/18 0319  TROPONINI <0.03 <0.03 <0.03    BNP (last 3 results) No results for input(s): BNP in the last 8760 hours.  ProBNP  (last 3 results) No results for input(s): PROBNP in the last 8760 hours.  Radiological Exams: No results found.  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 Acute on chronic respiratory failure with hypoxia continue with trach collar during the day resting on the ventilator at night.  Continue pulmonary toilet secretion management as well as supportive measures 2. Aspiration pneumonia continue to follow multifocal motor neuropathy continue supportive measures 3. Severe sepsis hemodynamically stable 4. Diastolic dysfunction continue to monitor fluid status   I have personally seen and evaluated the patient, evaluated laboratory and imaging results, formulated the assessment and plan and placed orders. The Patient requires high complexity decision making for assessment and support.  Case was discussed on Rounds with the Respiratory Therapy Staff  Yevonne Pax, MD [facility] Pulmonary Critical Care Medicine Sleep Medicine

## 2018-08-17 ENCOUNTER — Other Ambulatory Visit (HOSPITAL_COMMUNITY): Payer: Medicaid Other

## 2018-08-17 DIAGNOSIS — J690 Pneumonitis due to inhalation of food and vomit: Secondary | ICD-10-CM | POA: Diagnosis not present

## 2018-08-17 DIAGNOSIS — A419 Sepsis, unspecified organism: Secondary | ICD-10-CM | POA: Diagnosis not present

## 2018-08-17 DIAGNOSIS — I5032 Chronic diastolic (congestive) heart failure: Secondary | ICD-10-CM | POA: Diagnosis not present

## 2018-08-17 DIAGNOSIS — J9621 Acute and chronic respiratory failure with hypoxia: Secondary | ICD-10-CM | POA: Diagnosis not present

## 2018-08-17 LAB — BASIC METABOLIC PANEL
Anion gap: 17 — ABNORMAL HIGH (ref 5–15)
BUN: <5 mg/dL — ABNORMAL LOW (ref 6–20)
CO2: 28 mmol/L (ref 22–32)
Calcium: 7.2 mg/dL — ABNORMAL LOW (ref 8.9–10.3)
Chloride: 94 mmol/L — ABNORMAL LOW (ref 98–111)
Creatinine, Ser: 0.32 mg/dL — ABNORMAL LOW (ref 0.44–1.00)
GFR calc Af Amer: >60 mL/min (ref >60)
GFR calc non Af Amer: >60 mL/min (ref >60)
Glucose, Bld: 78 mg/dL (ref 70–99)
Potassium: 2.2 mmol/L — CL (ref 3.5–5.1)
Sodium: 139 mmol/L (ref 135–145)

## 2018-08-17 LAB — CBC
HCT: 27.5 % — ABNORMAL LOW (ref 36.0–46.0)
Hemoglobin: 8.8 g/dL — ABNORMAL LOW (ref 12.0–15.0)
MCH: 28.6 pg (ref 26.0–34.0)
MCHC: 32 g/dL (ref 30.0–36.0)
MCV: 89.3 fL (ref 80.0–100.0)
Platelets: 327 10*3/uL (ref 150–400)
RBC: 3.08 MIL/uL — ABNORMAL LOW (ref 3.87–5.11)
RDW: 14.6 % (ref 11.5–15.5)
WBC: 4.8 10*3/uL (ref 4.0–10.5)
nRBC: 0 % (ref 0.0–0.2)

## 2018-08-17 LAB — MAGNESIUM: Magnesium: 1.1 mg/dL — ABNORMAL LOW (ref 1.7–2.4)

## 2018-08-17 MED ORDER — GENERIC EXTERNAL MEDICATION
Status: DC
Start: ? — End: 2018-08-17

## 2018-08-17 MED ORDER — Medication
Status: DC
Start: ? — End: 2018-08-17

## 2018-08-17 NOTE — Progress Notes (Addendum)
Pulmonary Critical Care Medicine [facility] [place]   PULMONARY CRITICAL CARE SERVICE  PROGRESS NOTE  Date of Service: 08/17/2018  Tiffany Atkinson  PPJ:[id number]  DOB: 04-10-1970   DOA: 08/04/2018  Referring Physician: Carron Curie, MD  HPI: Tiffany Atkinson is a 48 y.o. female seen for follow up of Acute on Chronic Respiratory Failure.  Patient remains on aerosol trach collar 28% FiO2.  Satting well resting comfortably at this time.  Medications: Reviewed on Rounds  Physical Exam:  Vitals: Pulse 107 respirations 17 BP 143/87 O2 sat 99% temp 97.8  Ventilator Settings 28% aerosol trach collar  . General: Comfortable at this time . Eyes: Grossly normal lids, irises & conjunctiva . ENT: grossly tongue is normal . Neck: no obvious mass . Cardiovascular: S1 S2 normal no gallop . Respiratory: Coarse breath sounds . Abdomen: soft . Skin: no rash seen on limited exam . Musculoskeletal: not rigid . Psychiatric:unable to assess . Neurologic: no seizure no involuntary movements         Lab Data:   Basic Metabolic Panel: Recent Labs  Lab 08/11/18 0252 08/17/18 0613  NA  --  139  K 3.8 2.2*  CL  --  94*  CO2  --  28  GLUCOSE  --  78  BUN  --  <5*  CREATININE  --  0.32*  CALCIUM  --  7.2*  MG  --  1.1*    ABG: No results for input(s): PHART, PCO2ART, PO2ART, HCO3, O2SAT in the last 168 hours.  Liver Function Tests: No results for input(s): AST, ALT, ALKPHOS, BILITOT, PROT, ALBUMIN in the last 168 hours. No results for input(s): LIPASE, AMYLASE in the last 168 hours. No results for input(s): AMMONIA in the last 168 hours.  CBC: Recent Labs  Lab 08/17/18 0613  WBC 4.8  HGB 8.8*  HCT 27.5*  MCV 89.3  PLT 327    Cardiac Enzymes: Recent Labs  Lab 08/14/18 1542 08/14/18 2044 08/15/18 0319  TROPONINI <0.03 <0.03 <0.03    BNP (last 3 results) No results for input(s): BNP in the last 8760 hours.  ProBNP (last 3 results) No results for input(s):  PROBNP in the last 8760 hours.  Radiological Exams: Dg Chest Port 1 View  Result Date: 08/17/2018 CLINICAL DATA:  Respiratory failure EXAM: PORTABLE CHEST 1 VIEW COMPARISON:  08/09/2018 FINDINGS: No tracheostomy tube and right-sided PICC line are again seen in satisfactory position. Cardiac shadow is stable. Small pleural effusions are noted left greater than right similar to that seen on prior exam. No new focal infiltrate is seen. No bony abnormality is noted. IMPRESSION: No change and bilateral pleural effusions and underlying atelectatic changes. Electronically Signed   By: Alcide Clever M.D.   On: 08/17/2018 07:10   Dg Abd Portable 1v  Result Date: 08/17/2018 CLINICAL DATA:  Follow-up ileus EXAM: PORTABLE ABDOMEN - 1 VIEW COMPARISON:  08/13/2018 FINDINGS: Scattered large and small bowel gas is noted. No true obstructive changes are seen. The overall appearance is stable consistent with underlying generalized ileus. Gastrostomy catheter is noted in the left upper quadrant. No acute bony abnormality is seen. IMPRESSION: Stable generalized ileus. Electronically Signed   By: Alcide Clever M.D.   On: 08/17/2018 07:10    Assessment/Plan Active Problems:   Acute on chronic respiratory failure with hypoxia (HCC)   Aspiration pneumonia due to gastric secretions (HCC)   MMN (multifocal motor neuropathy) (HCC)   Severe sepsis (HCC)   Diastolic dysfunction with chronic heart  failure (HCC)   1. Acute on chronic respiratory failure with hypoxia continue with trach collar during the day and rest on ventilator at night.  Continue aggressive pulmonary toilet and supportive measures 2. Aspiration pneumonia continue to follow 3. Multifocal motor neuropathy continue supportive measures 4. Severe sepsis hemodynamically stable 5. Diastolic dysfunction continue to monitor fluid status   I have personally seen and evaluated the patient, evaluated laboratory and imaging results, formulated the assessment and  plan and placed orders. The Patient requires high complexity decision making for assessment and support.  Case was discussed on Rounds with the Respiratory Therapy Staff  Yevonne PaxSaadat A Khan, MD [facility] Pulmonary Critical Care Medicine Sleep Medicine

## 2018-08-18 DIAGNOSIS — A419 Sepsis, unspecified organism: Secondary | ICD-10-CM | POA: Diagnosis not present

## 2018-08-18 DIAGNOSIS — I5032 Chronic diastolic (congestive) heart failure: Secondary | ICD-10-CM | POA: Diagnosis not present

## 2018-08-18 DIAGNOSIS — J690 Pneumonitis due to inhalation of food and vomit: Secondary | ICD-10-CM | POA: Diagnosis not present

## 2018-08-18 DIAGNOSIS — J9621 Acute and chronic respiratory failure with hypoxia: Secondary | ICD-10-CM | POA: Diagnosis not present

## 2018-08-18 LAB — POTASSIUM: Potassium: 2.7 mmol/L — CL (ref 3.5–5.1)

## 2018-08-18 LAB — MAGNESIUM: Magnesium: 1.6 mg/dL — ABNORMAL LOW (ref 1.7–2.4)

## 2018-08-18 NOTE — Progress Notes (Addendum)
Pulmonary Critical Care Medicine [facility] [place]   PULMONARY CRITICAL CARE SERVICE  PROGRESS NOTE  Date of Service: 08/18/2018  Tiffany Atkinson  BTD:[id number]  DOB: [birth year]   DOA: 08/04/2018  Referring Physician: Carron Curie, MD  HPI: Tiffany Atkinson is a 48 y.o. female seen for follow up of Acute on Chronic Respiratory Failure.  Patient remains on aerosol trach collar 28% FiO2 and resting on the ventilator at night.  Currently doing well with no distress.  Medications: Reviewed on Rounds  Physical Exam:  Vitals: Pulse 91 respirations 14 BP 135/73 O2 sat 98% temp 98.3  Ventilator Settings 28% ATC  . General: Comfortable at this time . Eyes: Grossly normal lids, irises & conjunctiva . ENT: grossly tongue is normal . Neck: no obvious mass . Cardiovascular: S1 S2 normal no gallop . Respiratory: Coarse breath sounds . Abdomen: soft . Skin: no rash seen on limited exam . Musculoskeletal: not rigid . Psychiatric:unable to assess . Neurologic: no seizure no involuntary movements         Lab Data:   Basic Metabolic Panel: Recent Labs  Lab 08/17/18 0613 08/18/18 0559  NA 139  --   K 2.2* 2.7*  CL 94*  --   CO2 28  --   GLUCOSE 78  --   BUN <5*  --   CREATININE 0.32*  --   CALCIUM 7.2*  --   MG 1.1* 1.6*    ABG: No results for input(s): PHART, PCO2ART, PO2ART, HCO3, O2SAT in the last 168 hours.  Liver Function Tests: No results for input(s): AST, ALT, ALKPHOS, BILITOT, PROT, ALBUMIN in the last 168 hours. No results for input(s): LIPASE, AMYLASE in the last 168 hours. No results for input(s): AMMONIA in the last 168 hours.  CBC: Recent Labs  Lab 08/17/18 0613  WBC 4.8  HGB 8.8*  HCT 27.5*  MCV 89.3  PLT 327    Cardiac Enzymes: Recent Labs  Lab 08/14/18 1542 08/14/18 2044 08/15/18 0319  TROPONINI <0.03 <0.03 <0.03    BNP (last 3 results) No results for input(s): BNP in the last 8760 hours.  ProBNP (last 3 results) No results  for input(s): PROBNP in the last 8760 hours.  Radiological Exams: Dg Chest Port 1 View  Result Date: 08/17/2018 CLINICAL DATA:  Respiratory failure EXAM: PORTABLE CHEST 1 VIEW COMPARISON:  08/09/2018 FINDINGS: No tracheostomy tube and right-sided PICC line are again seen in satisfactory position. Cardiac shadow is stable. Small pleural effusions are noted left greater than right similar to that seen on prior exam. No new focal infiltrate is seen. No bony abnormality is noted. IMPRESSION: No change and bilateral pleural effusions and underlying atelectatic changes. Electronically Signed   By: Alcide Clever M.D.   On: 08/17/2018 07:10   Dg Abd Portable 1v  Result Date: 08/17/2018 CLINICAL DATA:  Follow-up ileus EXAM: PORTABLE ABDOMEN - 1 VIEW COMPARISON:  08/13/2018 FINDINGS: Scattered large and small bowel gas is noted. No true obstructive changes are seen. The overall appearance is stable consistent with underlying generalized ileus. Gastrostomy catheter is noted in the left upper quadrant. No acute bony abnormality is seen. IMPRESSION: Stable generalized ileus. Electronically Signed   By: Alcide Clever M.D.   On: 08/17/2018 07:10    Assessment/Plan Active Problems:   Acute on chronic respiratory failure with hypoxia (HCC)   Aspiration pneumonia due to gastric secretions (HCC)   MMN (multifocal motor neuropathy) (HCC)   Severe sepsis (HCC)   Diastolic dysfunction with  chronic heart failure (HCC)   1. Acute on chronic respiratory failure with hypoxia continue with aerosol trach collar at this time and resting on the ventilator at night.  Continue secretion management and pulmonary toilet 2. Aspiration pneumonia continue to follow 3. Multifocal motor neuropathy continue supportive measures 4. Severe sepsis hemodynamically stable 5. Diastolic dysfunction continue to monitor fluid status   I have personally seen and evaluated the patient, evaluated laboratory and imaging results, formulated the  assessment and plan and placed orders. The Patient requires high complexity decision making for assessment and support.  Case was discussed on Rounds with the Respiratory Therapy Staff  Yevonne PaxSaadat A Brandilee Pies, MD [facility] Pulmonary Critical Care Medicine Sleep Medicine

## 2018-08-19 ENCOUNTER — Other Ambulatory Visit (HOSPITAL_COMMUNITY): Payer: Medicaid Other

## 2018-08-19 DIAGNOSIS — I5032 Chronic diastolic (congestive) heart failure: Secondary | ICD-10-CM | POA: Diagnosis not present

## 2018-08-19 DIAGNOSIS — J9621 Acute and chronic respiratory failure with hypoxia: Secondary | ICD-10-CM | POA: Diagnosis not present

## 2018-08-19 DIAGNOSIS — J690 Pneumonitis due to inhalation of food and vomit: Secondary | ICD-10-CM | POA: Diagnosis not present

## 2018-08-19 DIAGNOSIS — A419 Sepsis, unspecified organism: Secondary | ICD-10-CM | POA: Diagnosis not present

## 2018-08-19 LAB — BASIC METABOLIC PANEL
Anion gap: 7 (ref 5–15)
BUN: <5 mg/dL — ABNORMAL LOW (ref 6–20)
CO2: 30 mmol/L (ref 22–32)
Calcium: 7.7 mg/dL — ABNORMAL LOW (ref 8.9–10.3)
Chloride: 103 mmol/L (ref 98–111)
Creatinine, Ser: <0.3 mg/dL — ABNORMAL LOW (ref 0.44–1.00)
Glucose, Bld: 87 mg/dL (ref 70–99)
Potassium: 3.7 mmol/L (ref 3.5–5.1)
Sodium: 140 mmol/L (ref 135–145)

## 2018-08-19 MED ORDER — GENERIC EXTERNAL MEDICATION
Status: DC
Start: ? — End: 2018-08-19

## 2018-08-19 NOTE — Progress Notes (Addendum)
Pulmonary Critical Care Medicine [facility] [place]   PULMONARY CRITICAL CARE SERVICE  PROGRESS NOTE  Date of Service: 08/19/2018  [name]  HYW:[id number]  DOB: [birth year]   DOA: 08/04/2018  Referring Physician: Carron Curie, MD  HPI: Tiffany Atkinson is a 48 y.o. female seen for follow up of Acute on Chronic Respiratory Failure.  Patient remains on aerosol trach collar 28% FiO2 continues to rest on the vent at night satting well with no acute distress at this time.  Medications: Reviewed on Rounds  Physical Exam:  Vitals: Pulse 93 respirations 16 BP 122/74 O2 sat 100% temp 98.8  Ventilator Settings ATC 28% during the day rest on ventilator at night.  . General: Comfortable at this time . Eyes: Grossly normal lids, irises & conjunctiva . ENT: grossly tongue is normal . Neck: no obvious mass . Cardiovascular: S1 S2 normal no gallop . Respiratory: Coarse breath sounds . Abdomen: soft . Skin: no rash seen on limited exam . Musculoskeletal: not rigid . Psychiatric:unable to assess . Neurologic: no seizure no involuntary movements         Lab Data:   Basic Metabolic Panel: Recent Labs  Lab 08/17/18 0613 08/18/18 0559 08/19/18 0515  NA 139  --  140  K 2.2* 2.7* 3.7  CL 94*  --  103  CO2 28  --  30  GLUCOSE 78  --  87  BUN <5*  --  <5*  CREATININE 0.32*  --  <0.30*  CALCIUM 7.2*  --  7.7*  MG 1.1* 1.6*  --     ABG: No results for input(s): PHART, PCO2ART, PO2ART, HCO3, O2SAT in the last 168 hours.  Liver Function Tests: No results for input(s): AST, ALT, ALKPHOS, BILITOT, PROT, ALBUMIN in the last 168 hours. No results for input(s): LIPASE, AMYLASE in the last 168 hours. No results for input(s): AMMONIA in the last 168 hours.  CBC: Recent Labs  Lab 08/17/18 0613  WBC 4.8  HGB 8.8*  HCT 27.5*  MCV 89.3  PLT 327    Cardiac Enzymes: Recent Labs  Lab 08/14/18 1542 08/14/18 2044 08/15/18 0319  TROPONINI <0.03 <0.03 <0.03    BNP  (last 3 results) No results for input(s): BNP in the last 8760 hours.  ProBNP (last 3 results) No results for input(s): PROBNP in the last 8760 hours.  Radiological Exams: Dg Abd 1 View  Result Date: 08/19/2018 CLINICAL DATA:  Ileus EXAM: ABDOMEN - 1 VIEW COMPARISON:  08/17/2018 FINDINGS: Moderate large and small bowel distension shows mild progression from the prior study. Negative for pneumatosis. No bowel wall edema. No other acute abnormality. IMPRESSION: Mild progression of distention of large and small bowel loops consistent with ileus. Electronically Signed   By: Marlan Palau M.D.   On: 08/19/2018 13:08    Assessment/Plan Active Problems:   Acute on chronic respiratory failure with hypoxia (HCC)   Aspiration pneumonia due to gastric secretions (HCC)   MMN (multifocal motor neuropathy) (HCC)   Severe sepsis (HCC)   Diastolic dysfunction with chronic heart failure (HCC)   1. Acute on chronic respiratory failure with hypoxia continue with aerosol trach collar at this time and resting on the vent at night.  Continue supportive measures and pulmonary toilet 2. Aspiration pneumonia continue to follow 3. Multifocal motor neuropathy continue supportive measures 4. Severe sepsis hemodynamically stable 5. Diastolic dysfunction continue to monitor fluid status   I have personally seen and evaluated the patient, evaluated laboratory and imaging results,  formulated the assessment and plan and placed orders. The Patient requires high complexity decision making for assessment and support.  Case was discussed on Rounds with the Respiratory Therapy Staff  Allyne Gee, MD [facility] Pulmonary Critical Care Medicine Sleep Medicine

## 2018-08-20 ENCOUNTER — Other Ambulatory Visit (HOSPITAL_COMMUNITY): Payer: Medicaid Other

## 2018-08-20 DIAGNOSIS — J9621 Acute and chronic respiratory failure with hypoxia: Secondary | ICD-10-CM | POA: Diagnosis not present

## 2018-08-20 DIAGNOSIS — J690 Pneumonitis due to inhalation of food and vomit: Secondary | ICD-10-CM | POA: Diagnosis not present

## 2018-08-20 DIAGNOSIS — I5032 Chronic diastolic (congestive) heart failure: Secondary | ICD-10-CM | POA: Diagnosis not present

## 2018-08-20 DIAGNOSIS — A419 Sepsis, unspecified organism: Secondary | ICD-10-CM | POA: Diagnosis not present

## 2018-08-20 MED ORDER — GENERIC EXTERNAL MEDICATION
Status: DC
Start: ? — End: 2018-08-20

## 2018-08-20 NOTE — Progress Notes (Addendum)
[facility] [place]   PULMONARY CRITICAL CARE SERVICE  PROGRESS NOTE  Date of Service: 08/20/2018  Barnett Applebaumngela A Simm  ZOX:[id number]  DOB: [birth year]   DOA: 08/04/2018  Referring Physician: Carron CurieAli Hijazi, MD  HPI: Barnett Applebaumngela A Rodenbaugh is a 48 y.o. female seen for follow up of Acute on Chronic Respiratory Failure.  Patient remains on aerosol trach collar 28% FiO2 during the day resting on the vent at night.  No distress or fever noted at this time.  Medications: Reviewed on Rounds  Physical Exam:  Vitals: Pulse 86 respirations 14 BP 110/73 O2 sat 100% temp 98.0  Ventilator Settings 28% ATC  . General: Comfortable at this time . Eyes: Grossly normal lids, irises & conjunctiva . ENT: grossly tongue is normal . Neck: no obvious mass . Cardiovascular: S1 S2 normal no gallop . Respiratory: Coarse breath sounds . Abdomen: soft . Skin: no rash seen on limited exam . Musculoskeletal: not rigid . Psychiatric:unable to assess . Neurologic: no seizure no involuntary movements         Lab Data:   Basic Metabolic Panel: Recent Labs  Lab 08/17/18 0613 08/18/18 0559 08/19/18 0515  NA 139  --  140  K 2.2* 2.7* 3.7  CL 94*  --  103  CO2 28  --  30  GLUCOSE 78  --  87  BUN <5*  --  <5*  CREATININE 0.32*  --  <0.30*  CALCIUM 7.2*  --  7.7*  MG 1.1* 1.6*  --     ABG: No results for input(s): PHART, PCO2ART, PO2ART, HCO3, O2SAT in the last 168 hours.  Liver Function Tests: No results for input(s): AST, ALT, ALKPHOS, BILITOT, PROT, ALBUMIN in the last 168 hours. No results for input(s): LIPASE, AMYLASE in the last 168 hours. No results for input(s): AMMONIA in the last 168 hours.  CBC: Recent Labs  Lab 08/17/18 0613  WBC 4.8  HGB 8.8*  HCT 27.5*  MCV 89.3  PLT 327    Cardiac Enzymes: Recent Labs  Lab 08/14/18 1542 08/14/18 2044 08/15/18 0319  TROPONINI <0.03 <0.03 <0.03    BNP (last 3 results) No results for input(s): BNP  in the last 8760 hours.  ProBNP (last 3 results) No results for input(s): PROBNP in the last 8760 hours.  Radiological Exams: Dg Abd 1 View  Result Date: 08/19/2018 CLINICAL DATA:  Ileus EXAM: ABDOMEN - 1 VIEW COMPARISON:  08/17/2018 FINDINGS: Moderate large and small bowel distension shows mild progression from the prior study. Negative for pneumatosis. No bowel wall edema. No other acute abnormality. IMPRESSION: Mild progression of distention of large and small bowel loops consistent with ileus. Electronically Signed   By: Marlan Palauharles  Clark M.D.   On: 08/19/2018 13:08   Dg Abd Portable 1v  Result Date: 08/20/2018 CLINICAL DATA:  Ileus. EXAM: PORTABLE ABDOMEN - 1 VIEW COMPARISON:  08/19/2018 FINDINGS: Portable supine view of the abdomen was obtained. Again noted is a large amount of gas throughout the abdomen. There is gas in the stomach, small bowel and large bowel. Degree of bowel gas distension is similar to the previous examination. Surgical clips in the right upper abdomen. Limited evaluation for free air on this supine image. IMPRESSION: Bowel gas distension throughout the abdomen has minimally changed. Bowel gas pattern is compatible with an ileus. Electronically Signed   By: Richarda OverlieAdam  Henn M.D.   On: 08/20/2018 12:04    Assessment/Plan Active Problems:   Acute on chronic respiratory failure  with hypoxia (HCC)   Aspiration pneumonia due to gastric secretions (HCC)   MMN (multifocal motor neuropathy) (HCC)   Severe sepsis (HCC)   Diastolic dysfunction with chronic heart failure (HCC)   1. Acute on chronic respiratory failure with hypoxia continue with aerosol trach collar at this time resting on the vent at night.  Continue pulmonary toilet supportive measures 2. Aspiration pneumonia continue to follow 3. Multifocal motor neuropathy continue supportive measures 4. Severe sepsis hemodynamically stable 5. Diastolic dysfunction continue to monitor fluid status   I have personally seen and  evaluated the patient, evaluated laboratory and imaging results, formulated the assessment and plan and placed orders. The Patient requires high complexity decision making for assessment and support.  Case was discussed on Rounds with the Respiratory Therapy Staff  Yevonne Pax, MD [facility] Pulmonary Critical Care Medicine Sleep Medicine

## 2018-08-21 ENCOUNTER — Other Ambulatory Visit (HOSPITAL_COMMUNITY): Payer: Medicaid Other

## 2018-08-21 DIAGNOSIS — I5032 Chronic diastolic (congestive) heart failure: Secondary | ICD-10-CM | POA: Diagnosis not present

## 2018-08-21 DIAGNOSIS — A419 Sepsis, unspecified organism: Secondary | ICD-10-CM | POA: Diagnosis not present

## 2018-08-21 DIAGNOSIS — J690 Pneumonitis due to inhalation of food and vomit: Secondary | ICD-10-CM | POA: Diagnosis not present

## 2018-08-21 DIAGNOSIS — J9621 Acute and chronic respiratory failure with hypoxia: Secondary | ICD-10-CM | POA: Diagnosis not present

## 2018-08-21 MED ORDER — Medication
Status: DC
Start: ? — End: 2018-08-21

## 2018-08-21 NOTE — Progress Notes (Addendum)
[facility] [place]   PULMONARY CRITICAL CARE SERVICE  PROGRESS NOTE  Date of Service: 08/21/2018  Tiffany Applebaumngela A Larimer  ZOX:[id number]:[id number]  DOB: 1970-09-16   DOA: 08/04/2018  Referring Physician: Carron CurieAli Hijazi, MD  HPI: Tiffany Atkinson is a 48 y.o. female seen for follow up of Acute on Chronic Respiratory Failure.  Patient continues on 28% aerosol trach collar during the day resting on the vent at night.  Satting well with no distress.  Medications: Reviewed on Rounds  Physical Exam:  Vitals: Pulse 103 respirations 20 BP 131/73 O2 sat 100% temp 98.7  Ventilator Settings 28% ATC  . General: Comfortable at this time . Eyes: Grossly normal lids, irises & conjunctiva . ENT: grossly tongue is normal . Neck: no obvious mass . Cardiovascular: S1 S2 normal no gallop . Respiratory: Coarse breath sounds . Abdomen: soft . Skin: no rash seen on limited exam . Musculoskeletal: not rigid . Psychiatric:unable to assess . Neurologic: no seizure no involuntary movements         Lab Data:   Basic Metabolic Panel: Recent Labs  Lab 08/17/18 0613 08/18/18 0559 08/19/18 0515  NA 139  --  140  K 2.2* 2.7* 3.7  CL 94*  --  103  CO2 28  --  30  GLUCOSE 78  --  87  BUN <5*  --  <5*  CREATININE 0.32*  --  <0.30*  CALCIUM 7.2*  --  7.7*  MG 1.1* 1.6*  --     ABG: No results for input(s): PHART, PCO2ART, PO2ART, HCO3, O2SAT in the last 168 hours.  Liver Function Tests: No results for input(s): AST, ALT, ALKPHOS, BILITOT, PROT, ALBUMIN in the last 168 hours. No results for input(s): LIPASE, AMYLASE in the last 168 hours. No results for input(s): AMMONIA in the last 168 hours.  CBC: Recent Labs  Lab 08/17/18 0613  WBC 4.8  HGB 8.8*  HCT 27.5*  MCV 89.3  PLT 327    Cardiac Enzymes: Recent Labs  Lab 08/14/18 2044 08/15/18 0319  TROPONINI <0.03 <0.03    BNP (last 3 results) No results for input(s): BNP in the last 8760 hours.  ProBNP  (last 3 results) No results for input(s): PROBNP in the last 8760 hours.  Radiological Exams: Dg Abd 1 View  Result Date: 08/21/2018 CLINICAL DATA:  Ileus EXAM: ABDOMEN - 1 VIEW COMPARISON:  08/20/2018 FINDINGS: Diffusely distended large and small bowel loops unchanged. Gastrostomy tube overlying the stomach. No bowel wall edema. Surgical clips right upper quadrant. IMPRESSION: Adynamic ileus unchanged. Electronically Signed   By: Marlan Palauharles  Clark M.D.   On: 08/21/2018 09:46   Dg Abd Portable 1v  Result Date: 08/20/2018 CLINICAL DATA:  Ileus. EXAM: PORTABLE ABDOMEN - 1 VIEW COMPARISON:  08/19/2018 FINDINGS: Portable supine view of the abdomen was obtained. Again noted is a large amount of gas throughout the abdomen. There is gas in the stomach, small bowel and large bowel. Degree of bowel gas distension is similar to the previous examination. Surgical clips in the right upper abdomen. Limited evaluation for free air on this supine image. IMPRESSION: Bowel gas distension throughout the abdomen has minimally changed. Bowel gas pattern is compatible with an ileus. Electronically Signed   By: Richarda OverlieAdam  Henn M.D.   On: 08/20/2018 12:04    Assessment/Plan Active Problems:   Acute on chronic respiratory failure with hypoxia (HCC)   Aspiration pneumonia due to gastric secretions (HCC)   MMN (multifocal motor neuropathy) (HCC)  Severe sepsis (HCC)   Diastolic dysfunction with chronic heart failure (HCC)   1. Acute on chronic respiratory failure with hypoxia continue with aerosol trach collar during the day and resting on vent at night.  Continue supportive measures and pulmonary toilet 2. Aspiration pneumonia continue to follow 3. Multifocal motor neuropathy continue supportive measures 4. Severe sepsis hemodynamically stable 5. Diastolic dysfunction continue to monitor for this   I have personally seen and evaluated the patient, evaluated laboratory and imaging results, formulated the assessment and  plan and placed orders. The Patient requires high complexity decision making for assessment and support.  Case was discussed on Rounds with the Respiratory Therapy Staff  Yevonne Pax, MD [facility] Pulmonary Critical Care Medicine Sleep Medicine

## 2018-08-22 DIAGNOSIS — J9621 Acute and chronic respiratory failure with hypoxia: Secondary | ICD-10-CM | POA: Diagnosis not present

## 2018-08-22 DIAGNOSIS — A419 Sepsis, unspecified organism: Secondary | ICD-10-CM | POA: Diagnosis not present

## 2018-08-22 DIAGNOSIS — I5032 Chronic diastolic (congestive) heart failure: Secondary | ICD-10-CM | POA: Diagnosis not present

## 2018-08-22 DIAGNOSIS — J690 Pneumonitis due to inhalation of food and vomit: Secondary | ICD-10-CM | POA: Diagnosis not present

## 2018-08-22 NOTE — Progress Notes (Addendum)
Pulmonary Critical Care Medicine [facility] [place]   PULMONARY CRITICAL CARE SERVICE  PROGRESS NOTE  Date of Service: 08/22/2018  Tiffany Atkinson  MBE:[id number]  DOB: 07-15-1970   DOA: 08/04/2018  Referring Physician: Carron Curie, MD  HPI: Tiffany Atkinson is a 48 y.o. female seen for follow up of Acute on Chronic Respiratory Failure.  Patient remains on aerosol trach collar 28% FiO2 during the day and resting on the vent at night.  Satting well with no distress.  Medications: Reviewed on Rounds  Physical Exam:  Vitals: Pulse 98 respirations 24 BP 118/85 O2 sat 100% temp 98.3  Ventilator Settings 28% ATC  . General: Comfortable at this time . Eyes: Grossly normal lids, irises & conjunctiva . ENT: grossly tongue is normal . Neck: no obvious mass . Cardiovascular: S1 S2 normal no gallop . Respiratory: Coarse breath sounds . Abdomen: soft . Skin: no rash seen on limited exam . Musculoskeletal: not rigid . Psychiatric:unable to assess . Neurologic: no seizure no involuntary movements         Lab Data:   Basic Metabolic Panel: Recent Labs  Lab 08/17/18 0613 08/18/18 0559 08/19/18 0515  NA 139  --  140  K 2.2* 2.7* 3.7  CL 94*  --  103  CO2 28  --  30  GLUCOSE 78  --  87  BUN <5*  --  <5*  CREATININE 0.32*  --  <0.30*  CALCIUM 7.2*  --  7.7*  MG 1.1* 1.6*  --     ABG: No results for input(s): PHART, PCO2ART, PO2ART, HCO3, O2SAT in the last 168 hours.  Liver Function Tests: No results for input(s): AST, ALT, ALKPHOS, BILITOT, PROT, ALBUMIN in the last 168 hours. No results for input(s): LIPASE, AMYLASE in the last 168 hours. No results for input(s): AMMONIA in the last 168 hours.  CBC: Recent Labs  Lab 08/17/18 0613  WBC 4.8  HGB 8.8*  HCT 27.5*  MCV 89.3  PLT 327    Cardiac Enzymes: No results for input(s): CKTOTAL, CKMB, CKMBINDEX, TROPONINI in the last 168 hours.  BNP (last 3 results) No results for input(s): BNP in the last 8760  hours.  ProBNP (last 3 results) No results for input(s): PROBNP in the last 8760 hours.  Radiological Exams: Dg Abd 1 View  Result Date: 08/21/2018 CLINICAL DATA:  Ileus EXAM: ABDOMEN - 1 VIEW COMPARISON:  08/20/2018 FINDINGS: Diffusely distended large and small bowel loops unchanged. Gastrostomy tube overlying the stomach. No bowel wall edema. Surgical clips right upper quadrant. IMPRESSION: Adynamic ileus unchanged. Electronically Signed   By: Marlan Palau M.D.   On: 08/21/2018 09:46    Assessment/Plan Active Problems:   Acute on chronic respiratory failure with hypoxia (HCC)   Aspiration pneumonia due to gastric secretions (HCC)   MMN (multifocal motor neuropathy) (HCC)   Severe sepsis (HCC)   Diastolic dysfunction with chronic heart failure (HCC)   1. Acute on chronic respiratory failure with hypoxia continue with aerosol trach collar during the day and resting on vent at night.  Continue supportive measures and pulmonary toilet 2. Aspiration pneumonia continue to follow 3. Multifocal motor neuropathy continue supportive measures 4. Severe sepsis hemodynamically stable 5. Diastolic dysfunction continue to monitor for this   I have personally seen and evaluated the patient, evaluated laboratory and imaging results, formulated the assessment and plan and placed orders. The Patient requires high complexity decision making for assessment and support.  Case was discussed on Rounds with  the Respiratory Therapy Staff  Allyne Gee, MD [facility] Pulmonary Critical Care Medicine Sleep Medicine

## 2018-08-23 DIAGNOSIS — A419 Sepsis, unspecified organism: Secondary | ICD-10-CM | POA: Diagnosis not present

## 2018-08-23 DIAGNOSIS — J9621 Acute and chronic respiratory failure with hypoxia: Secondary | ICD-10-CM | POA: Diagnosis not present

## 2018-08-23 DIAGNOSIS — J690 Pneumonitis due to inhalation of food and vomit: Secondary | ICD-10-CM | POA: Diagnosis not present

## 2018-08-23 DIAGNOSIS — I5032 Chronic diastolic (congestive) heart failure: Secondary | ICD-10-CM | POA: Diagnosis not present

## 2018-08-23 NOTE — Progress Notes (Addendum)
Pulmonary Critical Care Medicine [facility] [place]   PULMONARY CRITICAL CARE SERVICE  PROGRESS NOTE  Date of Service: 08/23/2018  Tiffany Atkinson  BTD:[id number]  DOB: 01-Feb-1971   DOA: 08/04/2018  Referring Physician: Carron Curie, MD  HPI: Tiffany Atkinson is a 48 y.o. female seen for follow up of Acute on Chronic Respiratory Failure.  Patient remains on aerosol trach collar 28% during the day and resting on the vent at night.  Currently satting 100% with no distress or fever noted.  Medications: Reviewed on Rounds  Physical Exam:  Vitals: Pulse 93 respirations 24 BP 137/81 O2 sat 100% temp 98.7  Ventilator Settings ATC 28%  . General: Comfortable at this time . Eyes: Grossly normal lids, irises & conjunctiva . ENT: grossly tongue is normal . Neck: no obvious mass . Cardiovascular: S1 S2 normal no gallop . Respiratory: Coarse breath sounds . Abdomen: soft . Skin: no rash seen on limited exam . Musculoskeletal: not rigid . Psychiatric:unable to assess . Neurologic: no seizure no involuntary movements         Lab Data:   Basic Metabolic Panel: Recent Labs  Lab 08/17/18 0613 08/18/18 0559 08/19/18 0515  NA 139  --  140  K 2.2* 2.7* 3.7  CL 94*  --  103  CO2 28  --  30  GLUCOSE 78  --  87  BUN <5*  --  <5*  CREATININE 0.32*  --  <0.30*  CALCIUM 7.2*  --  7.7*  MG 1.1* 1.6*  --     ABG: No results for input(s): PHART, PCO2ART, PO2ART, HCO3, O2SAT in the last 168 hours.  Liver Function Tests: No results for input(s): AST, ALT, ALKPHOS, BILITOT, PROT, ALBUMIN in the last 168 hours. No results for input(s): LIPASE, AMYLASE in the last 168 hours. No results for input(s): AMMONIA in the last 168 hours.  CBC: Recent Labs  Lab 08/17/18 0613  WBC 4.8  HGB 8.8*  HCT 27.5*  MCV 89.3  PLT 327    Cardiac Enzymes: No results for input(s): CKTOTAL, CKMB, CKMBINDEX, TROPONINI in the last 168 hours.  BNP (last 3 results) No results for input(s): BNP  in the last 8760 hours.  ProBNP (last 3 results) No results for input(s): PROBNP in the last 8760 hours.  Radiological Exams: No results found.  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 Acute on chronic respiratory failure with hypoxia continue with aerosol trach collar 20% FiO2 during the day and resting on the ventilator at night.  Continue pulmonary toilet and supportive measures. 2. Aspiration pneumonia continue to follow 3. Multifocal motor neuropathy continue supportive measures 4. Severe sepsis hemodynamically stable 5. Diastolic dysfunction continue to monitor   I have personally seen and evaluated the patient, evaluated laboratory and imaging results, formulated the assessment and plan and placed orders. The Patient requires high complexity decision making for assessment and support.  Case was discussed on Rounds with the Respiratory Therapy Staff  Yevonne Pax, MD [facility] Pulmonary Critical Care Medicine Sleep Medicine

## 2018-08-24 ENCOUNTER — Other Ambulatory Visit (HOSPITAL_COMMUNITY): Payer: Medicaid Other

## 2018-08-24 DIAGNOSIS — J9621 Acute and chronic respiratory failure with hypoxia: Secondary | ICD-10-CM | POA: Diagnosis not present

## 2018-08-24 DIAGNOSIS — I5032 Chronic diastolic (congestive) heart failure: Secondary | ICD-10-CM | POA: Diagnosis not present

## 2018-08-24 DIAGNOSIS — A419 Sepsis, unspecified organism: Secondary | ICD-10-CM | POA: Diagnosis not present

## 2018-08-24 DIAGNOSIS — J690 Pneumonitis due to inhalation of food and vomit: Secondary | ICD-10-CM | POA: Diagnosis not present

## 2018-08-24 LAB — BASIC METABOLIC PANEL
Anion gap: 8 (ref 5–15)
BUN: <5 mg/dL — ABNORMAL LOW (ref 6–20)
CO2: 29 mmol/L (ref 22–32)
Calcium: 8.2 mg/dL — ABNORMAL LOW (ref 8.9–10.3)
Chloride: 103 mmol/L (ref 98–111)
Creatinine, Ser: 0.37 mg/dL — ABNORMAL LOW (ref 0.44–1.00)
GFR calc Af Amer: >60 mL/min (ref >60)
GFR calc non Af Amer: >60 mL/min (ref >60)
Glucose, Bld: 102 mg/dL — ABNORMAL HIGH (ref 70–99)
Potassium: 2.6 mmol/L — CL (ref 3.5–5.1)
Sodium: 140 mmol/L (ref 135–145)

## 2018-08-24 LAB — CBC
HCT: 30.9 % — ABNORMAL LOW (ref 36.0–46.0)
Hemoglobin: 9.9 g/dL — ABNORMAL LOW (ref 12.0–15.0)
MCH: 28.4 pg (ref 26.0–34.0)
MCHC: 32 g/dL (ref 30.0–36.0)
MCV: 88.5 fL (ref 80.0–100.0)
Platelets: 318 10*3/uL (ref 150–400)
RBC: 3.49 MIL/uL — ABNORMAL LOW (ref 3.87–5.11)
RDW: 14.5 % (ref 11.5–15.5)
WBC: 7.4 10*3/uL (ref 4.0–10.5)
nRBC: 0 % (ref 0.0–0.2)

## 2018-08-24 LAB — MAGNESIUM: Magnesium: 1.4 mg/dL — ABNORMAL LOW (ref 1.7–2.4)

## 2018-08-24 LAB — PHOSPHORUS: Phosphorus: 3.3 mg/dL (ref 2.5–4.6)

## 2018-08-24 LAB — TRIGLYCERIDES: Triglycerides: 77 mg/dL (ref <150)

## 2018-08-24 MED ORDER — Medication
Status: DC
Start: ? — End: 2018-08-24

## 2018-08-24 NOTE — Progress Notes (Addendum)
Pulmonary Critical Care Medicine [facility] [place]   PULMONARY CRITICAL CARE SERVICE  PROGRESS NOTE  Date of Service: 08/24/2018  Tiffany Atkinson  JYN:[id number]  DOB: May 18, 1970   DOA: 08/04/2018  Referring Physician: Carron Curie, MD  HPI: Tiffany Atkinson is a 48 y.o. female seen for follow up of Acute on Chronic Respiratory Failure.  Patient remains on aerosol trach collar 28% during the day and resting on the vent at night.  Satting well with no distress or fever at this time.  Medications: Reviewed on Rounds  Physical Exam:  Vitals: Pulse 93 respirations 26 BP 117/76 O2 sat 100% temp 97.9  Ventilator Settings ATC 28%  . General: Comfortable at this time . Eyes: Grossly normal lids, irises & conjunctiva . ENT: grossly tongue is normal . Neck: no obvious mass . Cardiovascular: S1 S2 normal no gallop . Respiratory: Coarse breath sounds . Abdomen: soft . Skin: no rash seen on limited exam . Musculoskeletal: not rigid . Psychiatric:unable to assess . Neurologic: no seizure no involuntary movements         Lab Data:   Basic Metabolic Panel: Recent Labs  Lab 08/18/18 0559 08/19/18 0515 08/24/18 0555  NA  --  140 140  K 2.7* 3.7 2.6*  CL  --  103 103  CO2  --  30 29  GLUCOSE  --  87 102*  BUN  --  <5* <5*  CREATININE  --  <0.30* 0.37*  CALCIUM  --  7.7* 8.2*  MG 1.6*  --  1.4*  PHOS  --   --  3.3    ABG: No results for input(s): PHART, PCO2ART, PO2ART, HCO3, O2SAT in the last 168 hours.  Liver Function Tests: No results for input(s): AST, ALT, ALKPHOS, BILITOT, PROT, ALBUMIN in the last 168 hours. No results for input(s): LIPASE, AMYLASE in the last 168 hours. No results for input(s): AMMONIA in the last 168 hours.  CBC: Recent Labs  Lab 08/24/18 0555  WBC 7.4  HGB 9.9*  HCT 30.9*  MCV 88.5  PLT 318    Cardiac Enzymes: No results for input(s): CKTOTAL, CKMB, CKMBINDEX, TROPONINI in the last 168 hours.  BNP (last 3 results) No  results for input(s): BNP in the last 8760 hours.  ProBNP (last 3 results) No results for input(s): PROBNP in the last 8760 hours.  Radiological Exams: No results found.  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 Acute on chronic respiratory failure with hypoxia continue with aerosol trach collar during the day and resting on the vent at night.  Continue supportive measures and pulmonary toilet. 2. Aspiration pneumonia continue to follow 3. Multifocal motor neuropathy continue supportive measures 4. Severe sepsis hemodynamically stable 5. Diastolic dysfunction continue to monitor   I have personally seen and evaluated the patient, evaluated laboratory and imaging results, formulated the assessment and plan and placed orders. The Patient requires high complexity decision making for assessment and support.  Case was discussed on Rounds with the Respiratory Therapy Staff  Yevonne Pax, MD [facility] Pulmonary Critical Care Medicine Sleep Medicine

## 2018-08-25 DIAGNOSIS — I5032 Chronic diastolic (congestive) heart failure: Secondary | ICD-10-CM | POA: Diagnosis not present

## 2018-08-25 DIAGNOSIS — A419 Sepsis, unspecified organism: Secondary | ICD-10-CM | POA: Diagnosis not present

## 2018-08-25 DIAGNOSIS — J9621 Acute and chronic respiratory failure with hypoxia: Secondary | ICD-10-CM | POA: Diagnosis not present

## 2018-08-25 DIAGNOSIS — J690 Pneumonitis due to inhalation of food and vomit: Secondary | ICD-10-CM | POA: Diagnosis not present

## 2018-08-25 LAB — MAGNESIUM: Magnesium: 2 mg/dL (ref 1.7–2.4)

## 2018-08-25 LAB — POTASSIUM: Potassium: 3.6 mmol/L (ref 3.5–5.1)

## 2018-08-25 NOTE — Progress Notes (Addendum)
Pulmonary Critical Care Medicine [facility] [place]   PULMONARY CRITICAL CARE SERVICE  PROGRESS NOTE  Date of Service: 08/25/2018  BHOOMI QUI  WUJ:[id number]  DOB: [birth year]   DOA: 08/04/2018  Referring Physician: Carron Curie, MD  HPI: Tiffany Atkinson is a 48 y.o. female seen for follow up of Acute on Chronic Respiratory Failure.  Patient had an episode overnight of a mucous plug and had to be placed on an increased FiO2.  However was weaned this morning back to aerosol trach collar on 20% FiO2 and using PMV today without difficulty.  Medications: Reviewed on Rounds  Physical Exam:  Vitals: Pulse 96 respirations 25 BP 105/76 O2 sat 100% temp 97.4  Ventilator Settings ATC 28%  . General: Comfortable at this time . Eyes: Grossly normal lids, irises & conjunctiva . ENT: grossly tongue is normal . Neck: no obvious mass . Cardiovascular: S1 S2 normal no gallop . Respiratory: Coarse breath sounds . Abdomen: soft . Skin: no rash seen on limited exam . Musculoskeletal: not rigid . Psychiatric:unable to assess . Neurologic: no seizure no involuntary movements         Lab Data:   Basic Metabolic Panel: Recent Labs  Lab 08/19/18 0515 08/24/18 0555 08/25/18 1154  NA 140 140  --   K 3.7 2.6* 3.6  CL 103 103  --   CO2 30 29  --   GLUCOSE 87 102*  --   BUN <5* <5*  --   CREATININE <0.30* 0.37*  --   CALCIUM 7.7* 8.2*  --   MG  --  1.4* 2.0  PHOS  --  3.3  --     ABG: No results for input(s): PHART, PCO2ART, PO2ART, HCO3, O2SAT in the last 168 hours.  Liver Function Tests: No results for input(s): AST, ALT, ALKPHOS, BILITOT, PROT, ALBUMIN in the last 168 hours. No results for input(s): LIPASE, AMYLASE in the last 168 hours. No results for input(s): AMMONIA in the last 168 hours.  CBC: Recent Labs  Lab 08/24/18 0555  WBC 7.4  HGB 9.9*  HCT 30.9*  MCV 88.5  PLT 318    Cardiac Enzymes: No results for input(s): CKTOTAL, CKMB, CKMBINDEX,  TROPONINI in the last 168 hours.  BNP (last 3 results) No results for input(s): BNP in the last 8760 hours.  ProBNP (last 3 results) No results for input(s): PROBNP in the last 8760 hours.  Radiological Exams: Dg Chest Port 1 View  Result Date: 08/24/2018 CLINICAL DATA:  Shortness of breath EXAM: PORTABLE CHEST 1 VIEW COMPARISON:  Aug 17, 2018 FINDINGS: Tracheostomy catheter tip is 5.4 cm above the carina. Central catheter tip is at the cavoatrial junction. No pneumothorax. There is bibasilar atelectatic change with small right pleural effusion. Lungs elsewhere are clear. Heart size and pulmonary vascularity normal. No adenopathy. No bone lesions. IMPRESSION: Tube and catheter positions as described without pneumothorax. Bibasilar atelectasis. A small amount of superimposed pneumonia in the lung bases cannot entirely excluded. Small right pleural effusion. Cardiac silhouette within normal limits. Electronically Signed   By: Bretta Bang III M.D.   On: 08/24/2018 18:28   Dg Abd Portable 1v  Result Date: 08/24/2018 CLINICAL DATA:  Abdominal pain EXAM: PORTABLE ABDOMEN - 1 VIEW COMPARISON:  Aug 21, 2018. FINDINGS: Gastrostomy catheter is position in the left upper quadrant. Mild generalized bowel dilatation remains in a pattern most suggestive of ileus, similar to 3 days prior. No air-fluid levels. No free air evident. There are surgical clips  in the right upper abdomen. IMPRESSION: Findings felt to be indicative of persistent ileus. Bowel obstruction felt to be less likely. No free air. Gastrostomy in the left upper quadrant region. Electronically Signed   By: Bretta BangWilliam  Woodruff III M.D.   On: 08/24/2018 18:28    Assessment/Plan Active Problems:   Acute on chronic respiratory failure with hypoxia (HCC)   Aspiration pneumonia due to gastric secretions (HCC)   MMN (multifocal motor neuropathy) (HCC)   Severe sepsis (HCC)   Diastolic dysfunction with chronic heart failure (HCC)   1. Acute  on chronic respiratory failure with hypoxia continue aerosol trach collar in the day and resting on the vent at night.  Continue to watch for mucous plugging overnight.  Continue supportive measures and pulmonary toilet 2. Aspiration pneumonia continue to follow 3. Multifocal motor neuropathy continue supportive measures 4. Severe sepsis hemodynamically stable 5. Diastolic dysfunction continue to monitor   I have personally seen and evaluated the patient, evaluated laboratory and imaging results, formulated the assessment and plan and placed orders. The Patient requires high complexity decision making for assessment and support.  Case was discussed on Rounds with the Respiratory Therapy Staff  Yevonne PaxSaadat A Khan, MD [facility] Pulmonary Critical Care Medicine Sleep Medicine

## 2018-08-26 DIAGNOSIS — A419 Sepsis, unspecified organism: Secondary | ICD-10-CM | POA: Diagnosis not present

## 2018-08-26 DIAGNOSIS — I5032 Chronic diastolic (congestive) heart failure: Secondary | ICD-10-CM | POA: Diagnosis not present

## 2018-08-26 DIAGNOSIS — J690 Pneumonitis due to inhalation of food and vomit: Secondary | ICD-10-CM | POA: Diagnosis not present

## 2018-08-26 DIAGNOSIS — J9621 Acute and chronic respiratory failure with hypoxia: Secondary | ICD-10-CM | POA: Diagnosis not present

## 2018-08-26 NOTE — Progress Notes (Signed)
Pulmonary Critical Care Medicine [facility] [place]   PULMONARY CRITICAL CARE SERVICE  PROGRESS NOTE  Date of Service: 08/26/2018  Tiffany Atkinson  WUJ:[id number]  DOB: [birth year]   DOA: 08/04/2018  Referring Physician: Carron Curie, MD  HPI: Tiffany Atkinson is a 48 y.o. female seen for follow up of Acute on Chronic Respiratory Failure.  Patient currently is on T collar has been on 20% FiO2 using the ventilator at night  Medications: Reviewed on Rounds  Physical Exam:  Vitals: Temperature 97.0 pulse 98 respiratory rate 20 blood pressure 146/74 saturations 100%  Ventilator Settings on T collar right now 28% FiO2  . General: Comfortable at this time . Eyes: Grossly normal lids, irises & conjunctiva . ENT: grossly tongue is normal . Neck: no obvious mass . Cardiovascular: S1 S2 normal no gallop . Respiratory: No rhonchi coarse breath sounds are noted . Abdomen: soft . Skin: no rash seen on limited exam . Musculoskeletal: not rigid . Psychiatric:unable to assess . Neurologic: no seizure no involuntary movements         Lab Data:   Basic Metabolic Panel: Recent Labs  Lab 08/24/18 0555 08/25/18 1154  NA 140  --   K 2.6* 3.6  CL 103  --   CO2 29  --   GLUCOSE 102*  --   BUN <5*  --   CREATININE 0.37*  --   CALCIUM 8.2*  --   MG 1.4* 2.0  PHOS 3.3  --     ABG: No results for input(s): PHART, PCO2ART, PO2ART, HCO3, O2SAT in the last 168 hours.  Liver Function Tests: No results for input(s): AST, ALT, ALKPHOS, BILITOT, PROT, ALBUMIN in the last 168 hours. No results for input(s): LIPASE, AMYLASE in the last 168 hours. No results for input(s): AMMONIA in the last 168 hours.  CBC: Recent Labs  Lab 08/24/18 0555  WBC 7.4  HGB 9.9*  HCT 30.9*  MCV 88.5  PLT 318    Cardiac Enzymes: No results for input(s): CKTOTAL, CKMB, CKMBINDEX, TROPONINI in the last 168 hours.  BNP (last 3 results) No results for input(s): BNP in the last 8760  hours.  ProBNP (last 3 results) No results for input(s): PROBNP in the last 8760 hours.  Radiological Exams: Dg Chest Port 1 View  Result Date: 08/24/2018 CLINICAL DATA:  Shortness of breath EXAM: PORTABLE CHEST 1 VIEW COMPARISON:  Aug 17, 2018 FINDINGS: Tracheostomy catheter tip is 5.4 cm above the carina. Central catheter tip is at the cavoatrial junction. No pneumothorax. There is bibasilar atelectatic change with small right pleural effusion. Lungs elsewhere are clear. Heart size and pulmonary vascularity normal. No adenopathy. No bone lesions. IMPRESSION: Tube and catheter positions as described without pneumothorax. Bibasilar atelectasis. A small amount of superimposed pneumonia in the lung bases cannot entirely excluded. Small right pleural effusion. Cardiac silhouette within normal limits. Electronically Signed   By: Bretta Bang III M.D.   On: 08/24/2018 18:28   Dg Abd Portable 1v  Result Date: 08/24/2018 CLINICAL DATA:  Abdominal pain EXAM: PORTABLE ABDOMEN - 1 VIEW COMPARISON:  Aug 21, 2018. FINDINGS: Gastrostomy catheter is position in the left upper quadrant. Mild generalized bowel dilatation remains in a pattern most suggestive of ileus, similar to 3 days prior. No air-fluid levels. No free air evident. There are surgical clips in the right upper abdomen. IMPRESSION: Findings felt to be indicative of persistent ileus. Bowel obstruction felt to be less likely. No free air. Gastrostomy in the left  upper quadrant region. Electronically Signed   By: Bretta BangWilliam  Woodruff III M.D.   On: 08/24/2018 18:28    Assessment/Plan Active Problems:   Acute on chronic respiratory failure with hypoxia (HCC)   Aspiration pneumonia due to gastric secretions (HCC)   MMN (multifocal motor neuropathy) (HCC)   Severe sepsis (HCC)   Diastolic dysfunction with chronic heart failure (HCC)   1. Acute on chronic respiratory failure with hypoxia we will continue with T collar trials titrate oxygen as  tolerated and placed back on the ventilator at nighttime. 2. Aspiration pneumonia treated improving 3. Multifocal motor neuropathy patient requiring nocturnal ventilatory support 4. Diastolic dysfunction monitor fluid status 5. Severe sepsis resolved   I have personally seen and evaluated the patient, evaluated laboratory and imaging results, formulated the assessment and plan and placed orders. The Patient requires high complexity decision making for assessment and support.  Case was discussed on Rounds with the Respiratory Therapy Staff  Yevonne PaxSaadat A Maximilliano Kersh, MD [facility] Pulmonary Critical Care Medicine Sleep Medicine

## 2018-08-27 DIAGNOSIS — A419 Sepsis, unspecified organism: Secondary | ICD-10-CM | POA: Diagnosis not present

## 2018-08-27 DIAGNOSIS — J690 Pneumonitis due to inhalation of food and vomit: Secondary | ICD-10-CM | POA: Diagnosis not present

## 2018-08-27 DIAGNOSIS — J9621 Acute and chronic respiratory failure with hypoxia: Secondary | ICD-10-CM | POA: Diagnosis not present

## 2018-08-27 DIAGNOSIS — I5032 Chronic diastolic (congestive) heart failure: Secondary | ICD-10-CM | POA: Diagnosis not present

## 2018-08-27 NOTE — Progress Notes (Addendum)
Pulmonary Critical Care Medicine [facility] [place]   PULMONARY CRITICAL CARE SERVICE  PROGRESS NOTE  Date of Service: 08/27/2018  Tiffany Atkinson  SAY:[id number]  DOB: 30-Jul-1970   DOA: 08/04/2018  Referring Physician: Carron Curie, MD  HPI: Tiffany Atkinson is a 48 y.o. female seen for follow up of Acute on Chronic Respiratory Failure.  Patient continues on her trach collar 28% FiO2 during the day resting on the ventilator at night.  Currently satting well no distress.  Medications: Reviewed on Rounds  Physical Exam:  Vitals: Pulse 93 respirations 26 BP 101/57 O2 sat 100% 98.1  Ventilator Settings ATC 28%  . General: Comfortable at this time . Eyes: Grossly normal lids, irises & conjunctiva . ENT: grossly tongue is normal . Neck: no obvious mass . Cardiovascular: S1 S2 normal no gallop . Respiratory: No rales or rhonchi noted . Abdomen: soft . Skin: no rash seen on limited exam . Musculoskeletal: not rigid . Psychiatric:unable to assess . Neurologic: no seizure no involuntary movements         Lab Data:   Basic Metabolic Panel: Recent Labs  Lab 08/24/18 0555 08/25/18 1154  NA 140  --   K 2.6* 3.6  CL 103  --   CO2 29  --   GLUCOSE 102*  --   BUN <5*  --   CREATININE 0.37*  --   CALCIUM 8.2*  --   MG 1.4* 2.0  PHOS 3.3  --     ABG: No results for input(s): PHART, PCO2ART, PO2ART, HCO3, O2SAT in the last 168 hours.  Liver Function Tests: No results for input(s): AST, ALT, ALKPHOS, BILITOT, PROT, ALBUMIN in the last 168 hours. No results for input(s): LIPASE, AMYLASE in the last 168 hours. No results for input(s): AMMONIA in the last 168 hours.  CBC: Recent Labs  Lab 08/24/18 0555  WBC 7.4  HGB 9.9*  HCT 30.9*  MCV 88.5  PLT 318    Cardiac Enzymes: No results for input(s): CKTOTAL, CKMB, CKMBINDEX, TROPONINI in the last 168 hours.  BNP (last 3 results) No results for input(s): BNP in the last 8760 hours.  ProBNP (last 3  results) No results for input(s): PROBNP in the last 8760 hours.  Radiological Exams: No results found.  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 Acute on chronic respiratory failure with hypoxia we will continue with T collar trials titrate oxygen as tolerated and placed back on the ventilator at nighttime. 2. Aspiration pneumonia treated improving 3. Multifocal motor neuropathy patient requiring nocturnal ventilatory support 4. Diastolic dysfunction monitor fluid status 5. Severe sepsis resolved   I have personally seen and evaluated the patient, evaluated laboratory and imaging results, formulated the assessment and plan and placed orders. The Patient requires high complexity decision making for assessment and support.  Case was discussed on Rounds with the Respiratory Therapy Staff  Yevonne Pax, MD [facility] Pulmonary Critical Care Medicine Sleep Medicine

## 2018-08-28 DIAGNOSIS — A419 Sepsis, unspecified organism: Secondary | ICD-10-CM | POA: Diagnosis not present

## 2018-08-28 DIAGNOSIS — I5032 Chronic diastolic (congestive) heart failure: Secondary | ICD-10-CM | POA: Diagnosis not present

## 2018-08-28 DIAGNOSIS — J690 Pneumonitis due to inhalation of food and vomit: Secondary | ICD-10-CM | POA: Diagnosis not present

## 2018-08-28 DIAGNOSIS — J9621 Acute and chronic respiratory failure with hypoxia: Secondary | ICD-10-CM | POA: Diagnosis not present

## 2018-08-28 NOTE — Progress Notes (Addendum)
Pulmonary Critical Care Medicine [facility] [place]   PULMONARY CRITICAL CARE SERVICE  PROGRESS NOTE  Date of Service: 08/28/2018  Tiffany Atkinson  MMH:[id number]  DOB: 07-20-1970   DOA: 08/04/2018  Referring Physician: Carron Curie, MD  HPI: Tiffany Atkinson is a 49 y.o. female seen for follow up of Acute on Chronic Respiratory Failure.  Patient continues on 28% aerosol trach collar using PMV during the day resting on the ventilator at night.  Satting well with no distress noted.  Medications: Reviewed on Rounds  Physical Exam:  Vitals: Pulse 87 respirations 13 BP 111/64 O2 sat 100% temp 98.0  Ventilator Settings ATC 28%  . General: Comfortable at this time . Eyes: Grossly normal lids, irises & conjunctiva . ENT: grossly tongue is normal . Neck: no obvious mass . Cardiovascular: S1 S2 normal no gallop . Respiratory: No rales or rhonchi noted . Abdomen: soft . Skin: no rash seen on limited exam . Musculoskeletal: not rigid . Psychiatric:unable to assess . Neurologic: no seizure no involuntary movements         Lab Data:   Basic Metabolic Panel: Recent Labs  Lab 08/24/18 0555 08/25/18 1154  NA 140  --   K 2.6* 3.6  CL 103  --   CO2 29  --   GLUCOSE 102*  --   BUN <5*  --   CREATININE 0.37*  --   CALCIUM 8.2*  --   MG 1.4* 2.0  PHOS 3.3  --     ABG: No results for input(s): PHART, PCO2ART, PO2ART, HCO3, O2SAT in the last 168 hours.  Liver Function Tests: No results for input(s): AST, ALT, ALKPHOS, BILITOT, PROT, ALBUMIN in the last 168 hours. No results for input(s): LIPASE, AMYLASE in the last 168 hours. No results for input(s): AMMONIA in the last 168 hours.  CBC: Recent Labs  Lab 08/24/18 0555  WBC 7.4  HGB 9.9*  HCT 30.9*  MCV 88.5  PLT 318    Cardiac Enzymes: No results for input(s): CKTOTAL, CKMB, CKMBINDEX, TROPONINI in the last 168 hours.  BNP (last 3 results) No results for input(s): BNP in the last 8760 hours.  ProBNP  (last 3 results) No results for input(s): PROBNP in the last 8760 hours.  Radiological Exams: No results found.  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 Acute on chronic respiratory failure with hypoxia we will continue with T collar trials titrate oxygen as tolerated and placed back on the ventilator at nighttime. 2. Aspiration pneumonia treated improving 3. Multifocal motor neuropathy patient requiring nocturnal ventilatory support 4. Diastolic dysfunction monitor fluid status 5. Severe sepsis resolved   I have personally seen and evaluated the patient, evaluated laboratory and imaging results, formulated the assessment and plan and placed orders. The Patient requires high complexity decision making for assessment and support.  Case was discussed on Rounds with the Respiratory Therapy Staff  Yevonne Pax, MD [facility] Pulmonary Critical Care Medicine Sleep Medicine

## 2018-08-29 DIAGNOSIS — A419 Sepsis, unspecified organism: Secondary | ICD-10-CM | POA: Diagnosis not present

## 2018-08-29 DIAGNOSIS — I5032 Chronic diastolic (congestive) heart failure: Secondary | ICD-10-CM | POA: Diagnosis not present

## 2018-08-29 DIAGNOSIS — J690 Pneumonitis due to inhalation of food and vomit: Secondary | ICD-10-CM | POA: Diagnosis not present

## 2018-08-29 DIAGNOSIS — J9621 Acute and chronic respiratory failure with hypoxia: Secondary | ICD-10-CM | POA: Diagnosis not present

## 2018-08-29 LAB — BASIC METABOLIC PANEL
Anion gap: 10 (ref 5–15)
BUN: 14 mg/dL (ref 6–20)
CO2: 28 mmol/L (ref 22–32)
Calcium: 8.4 mg/dL — ABNORMAL LOW (ref 8.9–10.3)
Chloride: 99 mmol/L (ref 98–111)
Creatinine, Ser: <0.3 mg/dL — ABNORMAL LOW (ref 0.44–1.00)
Glucose, Bld: 93 mg/dL (ref 70–99)
Potassium: 3.2 mmol/L — ABNORMAL LOW (ref 3.5–5.1)
Sodium: 137 mmol/L (ref 135–145)

## 2018-08-29 LAB — CBC
HCT: 26.9 % — ABNORMAL LOW (ref 36.0–46.0)
Hemoglobin: 8.6 g/dL — ABNORMAL LOW (ref 12.0–15.0)
MCH: 28.3 pg (ref 26.0–34.0)
MCHC: 32 g/dL (ref 30.0–36.0)
MCV: 88.5 fL (ref 80.0–100.0)
Platelets: 288 10*3/uL (ref 150–400)
RBC: 3.04 MIL/uL — ABNORMAL LOW (ref 3.87–5.11)
RDW: 15.1 % (ref 11.5–15.5)
WBC: 5.7 10*3/uL (ref 4.0–10.5)
nRBC: 0 % (ref 0.0–0.2)

## 2018-08-29 NOTE — Progress Notes (Signed)
Pulmonary Critical Care Medicine [facility] [place]   PULMONARY CRITICAL CARE SERVICE  PROGRESS NOTE  Date of Service: 08/29/2018  Tiffany Atkinson  TIW:[id number]  DOB: May 07, 1970   DOA: 08/04/2018  Referring Physician: Carron Curie, MD  HPI: Tiffany Atkinson is a 48 y.o. female seen for follow up of Acute on Chronic Respiratory Failure.  Patient currently is on T collar has been on 28% FiO2 good saturations are noted at this time  Medications: Reviewed on Rounds  Physical Exam:  Vitals: Temperature 99.1 pulse 97 respiratory rate 22 blood pressure 106/69 saturations 100%  Ventilator Settings patient is on T collar now on 28% FiO2  . General: Comfortable at this time . Eyes: Grossly normal lids, irises & conjunctiva . ENT: grossly tongue is normal . Neck: no obvious mass . Cardiovascular: S1 S2 normal no gallop . Respiratory: No rhonchi or rales are noted at this time . Abdomen: soft . Skin: no rash seen on limited exam . Musculoskeletal: not rigid . Psychiatric:unable to assess . Neurologic: no seizure no involuntary movements         Lab Data:   Basic Metabolic Panel: Recent Labs  Lab 08/24/18 0555 08/25/18 1154 08/29/18 0950  NA 140  --  137  K 2.6* 3.6 3.2*  CL 103  --  99  CO2 29  --  28  GLUCOSE 102*  --  93  BUN <5*  --  14  CREATININE 0.37*  --  <0.30*  CALCIUM 8.2*  --  8.4*  MG 1.4* 2.0  --   PHOS 3.3  --   --     ABG: No results for input(s): PHART, PCO2ART, PO2ART, HCO3, O2SAT in the last 168 hours.  Liver Function Tests: No results for input(s): AST, ALT, ALKPHOS, BILITOT, PROT, ALBUMIN in the last 168 hours. No results for input(s): LIPASE, AMYLASE in the last 168 hours. No results for input(s): AMMONIA in the last 168 hours.  CBC: Recent Labs  Lab 08/24/18 0555 08/29/18 0950  WBC 7.4 5.7  HGB 9.9* 8.6*  HCT 30.9* 26.9*  MCV 88.5 88.5  PLT 318 288    Cardiac Enzymes: No results for input(s): CKTOTAL, CKMB, CKMBINDEX,  TROPONINI in the last 168 hours.  BNP (last 3 results) No results for input(s): BNP in the last 8760 hours.  ProBNP (last 3 results) No results for input(s): PROBNP in the last 8760 hours.  Radiological Exams: No results found.  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 Acute on chronic respiratory failure hypoxia we will continue with T collar trials titrate oxygen continue pulmonary toilet 2. Aspiration pneumonia we will continue with supportive care patient has been treated 3. Multifocal motor neuropathy unchanged rest on the ventilator at nighttime 4. Severe sepsis resolved 5. Diastolic dysfunction compensated   I have personally seen and evaluated the patient, evaluated laboratory and imaging results, formulated the assessment and plan and placed orders. The Patient requires high complexity decision making for assessment and support.  Case was discussed on Rounds with the Respiratory Therapy Staff  Yevonne Pax, MD [facility] Pulmonary Critical Care Medicine Sleep Medicine

## 2018-08-30 ENCOUNTER — Other Ambulatory Visit (HOSPITAL_COMMUNITY): Payer: Medicaid Other

## 2018-08-30 DIAGNOSIS — J690 Pneumonitis due to inhalation of food and vomit: Secondary | ICD-10-CM | POA: Diagnosis not present

## 2018-08-30 DIAGNOSIS — J9621 Acute and chronic respiratory failure with hypoxia: Secondary | ICD-10-CM | POA: Diagnosis not present

## 2018-08-30 DIAGNOSIS — A419 Sepsis, unspecified organism: Secondary | ICD-10-CM | POA: Diagnosis not present

## 2018-08-30 DIAGNOSIS — I5032 Chronic diastolic (congestive) heart failure: Secondary | ICD-10-CM | POA: Diagnosis not present

## 2018-08-30 LAB — RENAL FUNCTION PANEL
Albumin: 2.2 g/dL — ABNORMAL LOW (ref 3.5–5.0)
Anion gap: 8 (ref 5–15)
BUN: 10 mg/dL (ref 6–20)
CO2: 27 mmol/L (ref 22–32)
Calcium: 8.6 mg/dL — ABNORMAL LOW (ref 8.9–10.3)
Chloride: 100 mmol/L (ref 98–111)
Creatinine, Ser: <0.3 mg/dL — ABNORMAL LOW (ref 0.44–1.00)
Glucose, Bld: 104 mg/dL — ABNORMAL HIGH (ref 70–99)
Phosphorus: 4.2 mg/dL (ref 2.5–4.6)
Potassium: 3.4 mmol/L — ABNORMAL LOW (ref 3.5–5.1)
Sodium: 135 mmol/L (ref 135–145)

## 2018-08-30 LAB — CBC
HCT: 27.6 % — ABNORMAL LOW (ref 36.0–46.0)
Hemoglobin: 8.8 g/dL — ABNORMAL LOW (ref 12.0–15.0)
MCH: 28.1 pg (ref 26.0–34.0)
MCHC: 31.9 g/dL (ref 30.0–36.0)
MCV: 88.2 fL (ref 80.0–100.0)
Platelets: 354 10*3/uL (ref 150–400)
RBC: 3.13 MIL/uL — ABNORMAL LOW (ref 3.87–5.11)
RDW: 15.1 % (ref 11.5–15.5)
WBC: 6.6 10*3/uL (ref 4.0–10.5)
nRBC: 0 % (ref 0.0–0.2)

## 2018-08-30 LAB — MAGNESIUM: Magnesium: 1.8 mg/dL (ref 1.7–2.4)

## 2018-08-30 MED ORDER — Medication
Status: DC
Start: ? — End: 2018-08-30

## 2018-08-30 NOTE — Progress Notes (Addendum)
[facility] [place]   PULMONARY CRITICAL CARE SERVICE  PROGRESS NOTE  Date of Service: 08/30/2018  Tiffany Applebaumngela A Balch  EAV:[id number]  DOB: 08-Jan-1971   DOA: 08/04/2018  Referring Physician: Carron CurieAli Hijazi, MD  HPI: Tiffany Atkinson is a 48 y.o. female seen for follow up of Acute on Chronic Respiratory Failure.  Patient continues on t-collar, 28% of O2 during the day and rest on the ventilator at night.  Patient is resting calmly with no distress or fever at this time.  Medications: Reviewed on Rounds  Physical Exam:  Vitals: Pulse 88 respirations 14 BP 104/61 O2 sat 100% 97.6  Ventilator Settings ATC 28%  . General: Comfortable at this time . Eyes: Grossly normal lids, irises & conjunctiva . ENT: grossly tongue is normal . Neck: no obvious mass . Cardiovascular: S1 S2 normal no gallop . Respiratory: No rales or rhonchi noted . Abdomen: soft . Skin: no rash seen on limited exam . Musculoskeletal: not rigid . Psychiatric:unable to assess . Neurologic: no seizure no involuntary movements         Lab Data:   Basic Metabolic Panel: Recent Labs  Lab 08/24/18 0555 08/25/18 1154 08/29/18 0950 08/30/18 0742  NA 140  --  137 135  K 2.6* 3.6 3.2* 3.4*  CL 103  --  99 100  CO2 29  --  28 27  GLUCOSE 102*  --  93 104*  BUN <5*  --  14 10  CREATININE 0.37*  --  <0.30* <0.30*  CALCIUM 8.2*  --  8.4* 8.6*  MG 1.4* 2.0  --  1.8  PHOS 3.3  --   --  4.2    ABG: No results for input(s): PHART, PCO2ART, PO2ART, HCO3, O2SAT in the last 168 hours.  Liver Function Tests: Recent Labs  Lab 08/30/18 0742  ALBUMIN 2.2*   No results for input(s): LIPASE, AMYLASE in the last 168 hours. No results for input(s): AMMONIA in the last 168 hours.  CBC: Recent Labs  Lab 08/24/18 0555 08/29/18 0950 08/30/18 0742  WBC 7.4 5.7 6.6  HGB 9.9* 8.6* 8.8*  HCT 30.9* 26.9* 27.6*  MCV 88.5 88.5 88.2  PLT 318 288 354    Cardiac Enzymes: No results  for input(s): CKTOTAL, CKMB, CKMBINDEX, TROPONINI in the last 168 hours.  BNP (last 3 results) No results for input(s): BNP in the last 8760 hours.  ProBNP (last 3 results) No results for input(s): PROBNP in the last 8760 hours.  Radiological Exams: Dg Chest Port 1 View  Result Date: 08/30/2018 CLINICAL DATA:  Respiratory failure EXAM: PORTABLE CHEST 1 VIEW COMPARISON:  Aug 24, 2018 FINDINGS: Tracheostomy catheter tip is 4.1 cm above the carina. Central catheter tip is at the cavoatrial junction. No pneumothorax. There is no edema or consolidation. Heart is upper normal in size with pulmonary vascularity normal. No adenopathy. No bone lesions. Visualized loops of bowel appear dilated. IMPRESSION: Tube and catheter positions as described without pneumothorax. No edema or consolidation. Stable cardiac silhouette. Suspect bowel ileus. Electronically Signed   By: Bretta BangWilliam  Woodruff III M.D.   On: 08/30/2018 07:59    Assessment/Plan Active Problems:   Acute on chronic respiratory failure with hypoxia (HCC)   Aspiration pneumonia due to gastric secretions (HCC)   MMN (multifocal motor neuropathy) (HCC)   Severe sepsis (HCC)   Diastolic dysfunction with chronic heart failure (HCC)   1. Acute on chronic respiratory failure hypoxia we will continue with T collar trials  titrate oxygen continue pulmonary toilet 2. Aspiration pneumonia we will continue with supportive care patient has been treated 3. Multifocal motor neuropathy unchanged rest on the ventilator at nighttime 4. Severe sepsis resolved 5. Diastolic dysfunction compensated   I have personally seen and evaluated the patient, evaluated laboratory and imaging results, formulated the assessment and plan and placed orders. The Patient requires high complexity decision making for assessment and support.  Case was discussed on Rounds with the Respiratory Therapy Staff  Yevonne Pax, MD [facility] Pulmonary Critical Care Medicine Sleep  Medicine

## 2018-08-31 ENCOUNTER — Other Ambulatory Visit (HOSPITAL_COMMUNITY): Payer: Medicaid Other

## 2018-08-31 DIAGNOSIS — J690 Pneumonitis due to inhalation of food and vomit: Secondary | ICD-10-CM | POA: Diagnosis not present

## 2018-08-31 DIAGNOSIS — J9621 Acute and chronic respiratory failure with hypoxia: Secondary | ICD-10-CM | POA: Diagnosis not present

## 2018-08-31 DIAGNOSIS — A419 Sepsis, unspecified organism: Secondary | ICD-10-CM | POA: Diagnosis not present

## 2018-08-31 DIAGNOSIS — I5032 Chronic diastolic (congestive) heart failure: Secondary | ICD-10-CM | POA: Diagnosis not present

## 2018-08-31 LAB — TRIGLYCERIDES: Triglycerides: 74 mg/dL (ref <150)

## 2018-08-31 NOTE — Progress Notes (Addendum)
[facility] [place]   PULMONARY CRITICAL CARE SERVICE  PROGRESS NOTE  Date of Service: 08/31/2018  Tiffany Applebaumngela A Petrizzo  ZOX:[id number]  DOB: 26-Aug-1970   DOA: 08/04/2018  Referring Physician: Carron CurieAli Hijazi, MD  HPI: Tiffany Atkinson is a 48 y.o. female seen for follow up of Acute on Chronic Respiratory Failure.  Patient mains on 28% aerosol trach collar during the day and resting on the ventilator at night.  Currently satting well with no fever or distress noted.  Medications: Reviewed on Rounds  Physical Exam:  Vitals: Pulse 98 respiration 17 BP 119/62 O2 sat 100% temp 97.8  Ventilator Settings 28% ATC  . General: Comfortable at this time . Eyes: Grossly normal lids, irises & conjunctiva . ENT: grossly tongue is normal . Neck: no obvious mass . Cardiovascular: S1 S2 normal no gallop . Respiratory: No rales or rhonchi noted . Abdomen: soft . Skin: no rash seen on limited exam . Musculoskeletal: not rigid . Psychiatric:unable to assess . Neurologic: no seizure no involuntary movements         Lab Data:   Basic Metabolic Panel: Recent Labs  Lab 08/25/18 1154 08/29/18 0950 08/30/18 0742  NA  --  137 135  K 3.6 3.2* 3.4*  CL  --  99 100  CO2  --  28 27  GLUCOSE  --  93 104*  BUN  --  14 10  CREATININE  --  <0.30* <0.30*  CALCIUM  --  8.4* 8.6*  MG 2.0  --  1.8  PHOS  --   --  4.2    ABG: No results for input(s): PHART, PCO2ART, PO2ART, HCO3, O2SAT in the last 168 hours.  Liver Function Tests: Recent Labs  Lab 08/30/18 0742  ALBUMIN 2.2*   No results for input(s): LIPASE, AMYLASE in the last 168 hours. No results for input(s): AMMONIA in the last 168 hours.  CBC: Recent Labs  Lab 08/29/18 0950 08/30/18 0742  WBC 5.7 6.6  HGB 8.6* 8.8*  HCT 26.9* 27.6*  MCV 88.5 88.2  PLT 288 354    Cardiac Enzymes: No results for input(s): CKTOTAL, CKMB, CKMBINDEX, TROPONINI in the last 168 hours.  BNP (last 3 results) No  results for input(s): BNP in the last 8760 hours.  ProBNP (last 3 results) No results for input(s): PROBNP in the last 8760 hours.  Radiological Exams: Dg Chest Port 1 View  Result Date: 08/30/2018 CLINICAL DATA:  Respiratory failure EXAM: PORTABLE CHEST 1 VIEW COMPARISON:  Aug 24, 2018 FINDINGS: Tracheostomy catheter tip is 4.1 cm above the carina. Central catheter tip is at the cavoatrial junction. No pneumothorax. There is no edema or consolidation. Heart is upper normal in size with pulmonary vascularity normal. No adenopathy. No bone lesions. Visualized loops of bowel appear dilated. IMPRESSION: Tube and catheter positions as described without pneumothorax. No edema or consolidation. Stable cardiac silhouette. Suspect bowel ileus. Electronically Signed   By: Bretta BangWilliam  Woodruff III M.D.   On: 08/30/2018 07:59    Assessment/Plan Active Problems:   Acute on chronic respiratory failure with hypoxia (HCC)   Aspiration pneumonia due to gastric secretions (HCC)   MMN (multifocal motor neuropathy) (HCC)   Severe sepsis (HCC)   Diastolic dysfunction with chronic heart failure (HCC)   1. Acute on chronic respiratory failure hypoxia we will continue with T collar trials titrate oxygen continue pulmonary toilet 2. Aspiration pneumonia we will continue with supportive care patient has been treated 3. Multifocal motor  neuropathy unchanged rest on the ventilator at nighttime 4. Severe sepsis resolved 5. Diastolic dysfunction compensated   I have personally seen and evaluated the patient, evaluated laboratory and imaging results, formulated the assessment and plan and placed orders. The Patient requires high complexity decision making for assessment and support.  Case was discussed on Rounds with the Respiratory Therapy Staff  Yevonne Pax, MD [facility] Pulmonary Critical Care Medicine Sleep Medicine

## 2018-09-01 ENCOUNTER — Other Ambulatory Visit (HOSPITAL_COMMUNITY): Payer: Medicaid Other

## 2018-09-01 DIAGNOSIS — A419 Sepsis, unspecified organism: Secondary | ICD-10-CM | POA: Diagnosis not present

## 2018-09-01 DIAGNOSIS — I5032 Chronic diastolic (congestive) heart failure: Secondary | ICD-10-CM | POA: Diagnosis not present

## 2018-09-01 DIAGNOSIS — J690 Pneumonitis due to inhalation of food and vomit: Secondary | ICD-10-CM | POA: Diagnosis not present

## 2018-09-01 DIAGNOSIS — J9621 Acute and chronic respiratory failure with hypoxia: Secondary | ICD-10-CM | POA: Diagnosis not present

## 2018-09-01 NOTE — Progress Notes (Addendum)
Pulmonary Critical Care Medicine [facility] [place]   PULMONARY CRITICAL CARE SERVICE  PROGRESS NOTE  Date of Service: 09/01/2018  Tiffany Atkinson  [id number]  DOB: 06-05-1970   DOA: 08/04/2018  Referring Physician: Carron Curie, MD  HPI: Tiffany Atkinson is a 48 y.o. female seen for follow up of Acute on Chronic Respiratory Failure.  Patient continues on aerosol trach collar 28% FiO2 satting well noted at this time.  Medications: Reviewed on Rounds  Physical Exam:  Vitals: Pulse 106 respiration 23 BP 102/64 O2 sat 100% temp 97.2  Ventilator Settings 20% ATC  . General: Comfortable at this time . Eyes: Grossly normal lids, irises & conjunctiva . ENT: grossly tongue is normal . Neck: no obvious mass . Cardiovascular: S1 S2 normal no gallop . Respiratory: No rales or rhonchi noted . Abdomen: soft . Skin: no rash seen on limited exam . Musculoskeletal: not rigid . Psychiatric:unable to assess . Neurologic: no seizure no involuntary movements         Lab Data:   Basic Metabolic Panel: Recent Labs  Lab 08/29/18 0950 08/30/18 0742  NA 137 135  K 3.2* 3.4*  CL 99 100  CO2 28 27  GLUCOSE 93 104*  BUN 14 10  CREATININE <0.30* <0.30*  CALCIUM 8.4* 8.6*  MG  --  1.8  PHOS  --  4.2    ABG: No results for input(s): PHART, PCO2ART, PO2ART, HCO3, O2SAT in the last 168 hours.  Liver Function Tests: Recent Labs  Lab 08/30/18 0742  ALBUMIN 2.2*   No results for input(s): LIPASE, AMYLASE in the last 168 hours. No results for input(s): AMMONIA in the last 168 hours.  CBC: Recent Labs  Lab 08/29/18 0950 08/30/18 0742  WBC 5.7 6.6  HGB 8.6* 8.8*  HCT 26.9* 27.6*  MCV 88.5 88.2  PLT 288 354    Cardiac Enzymes: No results for input(s): CKTOTAL, CKMB, CKMBINDEX, TROPONINI in the last 168 hours.  BNP (last 3 results) No results for input(s): BNP in the last 8760 hours.  ProBNP (last 3 results) No results for input(s): PROBNP in the last 8760  hours.  Radiological Exams: Dg Abd Portable 1v  Result Date: 09/01/2018 CLINICAL DATA:  Follow-up ileus EXAM: PORTABLE ABDOMEN - 1 VIEW COMPARISON:  08/24/2018 FINDINGS: Gastrostomy remains in place. Redemonstrated is diffuse gaseous distension of the small and large bowel. This could represent ileus or pseudo obstruction. IMPRESSION: Persistent diffuse gaseous distension of the small and large bowel. Gastrostomy. The findings could indicate ileus or pseudo obstruction. Electronically Signed   By: Paulina Fusi M.D.   On: 09/01/2018 08:08    Assessment/Plan Active Problems:   Acute on chronic respiratory failure with hypoxia (HCC)   Aspiration pneumonia due to gastric secretions (HCC)   MMN (multifocal motor neuropathy) (HCC)   Severe sepsis (HCC)   Diastolic dysfunction with chronic heart failure (HCC)   1. Acute on chronic respiratory failure hypoxia we will continue with T collar trials titrate oxygen continue pulmonary toilet 2. Aspiration pneumonia we will continue with supportive care patient has been treated 3. Multifocal motor neuropathy unchanged rest on the ventilator at nighttime 4. Severe sepsis resolved 5. Diastolic dysfunction compensated   I have personally seen and evaluated the patient, evaluated laboratory and imaging results, formulated the assessment and plan and placed orders. The Patient requires high complexity decision making for assessment and support.  Case was discussed on Rounds with the Respiratory Therapy Staff  Yevonne Pax, MD [facility]  Pulmonary Critical Care Medicine Sleep Medicine

## 2018-09-02 DIAGNOSIS — I5032 Chronic diastolic (congestive) heart failure: Secondary | ICD-10-CM | POA: Diagnosis not present

## 2018-09-02 DIAGNOSIS — J9621 Acute and chronic respiratory failure with hypoxia: Secondary | ICD-10-CM | POA: Diagnosis not present

## 2018-09-02 DIAGNOSIS — A419 Sepsis, unspecified organism: Secondary | ICD-10-CM | POA: Diagnosis not present

## 2018-09-02 DIAGNOSIS — J690 Pneumonitis due to inhalation of food and vomit: Secondary | ICD-10-CM | POA: Diagnosis not present

## 2018-09-02 LAB — BASIC METABOLIC PANEL
Anion gap: 8 (ref 5–15)
BUN: 21 mg/dL — ABNORMAL HIGH (ref 6–20)
CO2: 27 mmol/L (ref 22–32)
Calcium: 8.5 mg/dL — ABNORMAL LOW (ref 8.9–10.3)
Chloride: 104 mmol/L (ref 98–111)
Creatinine, Ser: <0.3 mg/dL — ABNORMAL LOW (ref 0.44–1.00)
Glucose, Bld: 99 mg/dL (ref 70–99)
Potassium: 2.2 mmol/L — CL (ref 3.5–5.1)
Sodium: 139 mmol/L (ref 135–145)

## 2018-09-02 LAB — MAGNESIUM: Magnesium: 1.6 mg/dL — ABNORMAL LOW (ref 1.7–2.4)

## 2018-09-02 LAB — PHOSPHORUS: Phosphorus: 4.1 mg/dL (ref 2.5–4.6)

## 2018-09-02 NOTE — Progress Notes (Addendum)
Pulmonary Critical Care Medicine [facility] [place]   PULMONARY CRITICAL CARE SERVICE  PROGRESS NOTE  Date of Service: 09/02/2018  Tiffany Atkinson  CMK:[id number]  DOB: 1970-07-30   DOA: 08/04/2018  Referring Physician: Carron Curie, MD  HPI: Tiffany Atkinson is a 48 y.o. female seen for follow up of Acute on Chronic Respiratory Failure.  Patient continues on 28% aerosol trach collar during the day and resting on the vent at night.  Satting well with no fever or distress.  Medications: Reviewed on Rounds  Physical Exam:  Vitals: Pulse 93 respirations 16 BP 118/75 O2 sat 100% temp 97.8  Ventilator Settings 28% ATC  . General: Comfortable at this time . Eyes: Grossly normal lids, irises & conjunctiva . ENT: grossly tongue is normal . Neck: no obvious mass . Cardiovascular: S1 S2 normal no gallop . Respiratory: No rales or rhonchi noted . Abdomen: soft . Skin: no rash seen on limited exam . Musculoskeletal: not rigid . Psychiatric:unable to assess . Neurologic: no seizure no involuntary movements         Lab Data:   Basic Metabolic Panel: Recent Labs  Lab 08/29/18 0950 08/30/18 0742 09/02/18 0859  NA 137 135 139  K 3.2* 3.4* 2.2*  CL 99 100 104  CO2 28 27 27   GLUCOSE 93 104* 99  BUN 14 10 21*  CREATININE <0.30* <0.30* <0.30*  CALCIUM 8.4* 8.6* 8.5*  MG  --  1.8 1.6*  PHOS  --  4.2 4.1    ABG: No results for input(s): PHART, PCO2ART, PO2ART, HCO3, O2SAT in the last 168 hours.  Liver Function Tests: Recent Labs  Lab 08/30/18 0742  ALBUMIN 2.2*   No results for input(s): LIPASE, AMYLASE in the last 168 hours. No results for input(s): AMMONIA in the last 168 hours.  CBC: Recent Labs  Lab 08/29/18 0950 08/30/18 0742  WBC 5.7 6.6  HGB 8.6* 8.8*  HCT 26.9* 27.6*  MCV 88.5 88.2  PLT 288 354    Cardiac Enzymes: No results for input(s): CKTOTAL, CKMB, CKMBINDEX, TROPONINI in the last 168 hours.  BNP (last 3 results) No results for  input(s): BNP in the last 8760 hours.  ProBNP (last 3 results) No results for input(s): PROBNP in the last 8760 hours.  Radiological Exams: Dg Abd Portable 1v  Result Date: 09/01/2018 CLINICAL DATA:  Follow-up ileus EXAM: PORTABLE ABDOMEN - 1 VIEW COMPARISON:  08/24/2018 FINDINGS: Gastrostomy remains in place. Redemonstrated is diffuse gaseous distension of the small and large bowel. This could represent ileus or pseudo obstruction. IMPRESSION: Persistent diffuse gaseous distension of the small and large bowel. Gastrostomy. The findings could indicate ileus or pseudo obstruction. Electronically Signed   By: Paulina Fusi M.D.   On: 09/01/2018 08:08    Assessment/Plan Active Problems:   Acute on chronic respiratory failure with hypoxia (HCC)   Aspiration pneumonia due to gastric secretions (HCC)   MMN (multifocal motor neuropathy) (HCC)   Severe sepsis (HCC)   Diastolic dysfunction with chronic heart failure (HCC)   1. Acute on chronic respiratory failure with hypoxia continue with T collar trials and titrate oxygen per protocol.  Continue pulmonary toilet at this time. 2. Aspiration pneumonia continue supportive care 3. Multifocal motor neuropathy unchanged continue to rest on the ventilator at night 4. Severe sepsis resolved 5. Stomach dysfunction compensated   I have personally seen and evaluated the patient, evaluated laboratory and imaging results, formulated the assessment and plan and placed orders. The Patient requires high  complexity decision making for assessment and support.  Case was discussed on Rounds with the Respiratory Therapy Staff  Allyne Gee, MD [facility] Pulmonary Critical Care Medicine Sleep Medicine

## 2018-09-03 DIAGNOSIS — J9621 Acute and chronic respiratory failure with hypoxia: Secondary | ICD-10-CM | POA: Diagnosis not present

## 2018-09-03 DIAGNOSIS — J690 Pneumonitis due to inhalation of food and vomit: Secondary | ICD-10-CM | POA: Diagnosis not present

## 2018-09-03 DIAGNOSIS — I5032 Chronic diastolic (congestive) heart failure: Secondary | ICD-10-CM | POA: Diagnosis not present

## 2018-09-03 DIAGNOSIS — A419 Sepsis, unspecified organism: Secondary | ICD-10-CM | POA: Diagnosis not present

## 2018-09-03 LAB — POTASSIUM: Potassium: 3.7 mmol/L (ref 3.5–5.1)

## 2018-09-03 NOTE — Progress Notes (Addendum)
Pulmonary Critical Care Medicine [facility] [place]   PULMONARY CRITICAL CARE SERVICE  PROGRESS NOTE  Date of Service: 09/03/2018  Tiffany Atkinson  BTD:[id number]  DOB: [birth year]   DOA: 08/04/2018  Referring Physician: Carron Curie, MD  HPI: Tiffany Atkinson is a 48 y.o. female seen for follow up of Acute on Chronic Respiratory Failure.  Patient continues on 28% aerosol trach collar in the day and resting on the vent at night.  Satting well with no distress or fever.  Medications: Reviewed on Rounds  Physical Exam:  Vitals: Pulse 107 respirations 26 BP 124/73 O2 sat 100% temp 98.5  Ventilator Settings ATC 28%  . General: Comfortable at this time . Eyes: Grossly normal lids, irises & conjunctiva . ENT: grossly tongue is normal . Neck: no obvious mass . Cardiovascular: S1 S2 normal no gallop . Respiratory: No rales or rhonchi noted . Abdomen: soft . Skin: no rash seen on limited exam . Musculoskeletal: not rigid . Psychiatric:unable to assess . Neurologic: no seizure no involuntary movements         Lab Data:   Basic Metabolic Panel: Recent Labs  Lab 08/29/18 0950 08/30/18 0742 09/02/18 0859 09/03/18 0544  NA 137 135 139  --   K 3.2* 3.4* 2.2* 3.7  CL 99 100 104  --   CO2 28 27 27   --   GLUCOSE 93 104* 99  --   BUN 14 10 21*  --   CREATININE <0.30* <0.30* <0.30*  --   CALCIUM 8.4* 8.6* 8.5*  --   MG  --  1.8 1.6*  --   PHOS  --  4.2 4.1  --     ABG: No results for input(s): PHART, PCO2ART, PO2ART, HCO3, O2SAT in the last 168 hours.  Liver Function Tests: Recent Labs  Lab 08/30/18 0742  ALBUMIN 2.2*   No results for input(s): LIPASE, AMYLASE in the last 168 hours. No results for input(s): AMMONIA in the last 168 hours.  CBC: Recent Labs  Lab 08/29/18 0950 08/30/18 0742  WBC 5.7 6.6  HGB 8.6* 8.8*  HCT 26.9* 27.6*  MCV 88.5 88.2  PLT 288 354    Cardiac Enzymes: No results for input(s): CKTOTAL, CKMB, CKMBINDEX, TROPONINI in the  last 168 hours.  BNP (last 3 results) No results for input(s): BNP in the last 8760 hours.  ProBNP (last 3 results) No results for input(s): PROBNP in the last 8760 hours.  Radiological Exams: No results found.  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 Acute on chronic respiratory failure with hypoxia continue with T collar trials and titrate oxygen per protocol.  Continue pulmonary toilet at this time. 2. Aspiration pneumonia continue supportive care 3. Multifocal motor neuropathy unchanged continue to rest on the ventilator at night 4. Severe sepsis resolved 5. Stomach dysfunction compensated   I have personally seen and evaluated the patient, evaluated laboratory and imaging results, formulated the assessment and plan and placed orders. The Patient requires high complexity decision making for assessment and support.  Case was discussed on Rounds with the Respiratory Therapy Staff  Yevonne Pax, MD [facility] Pulmonary Critical Care Medicine Sleep Medicine

## 2018-09-04 DIAGNOSIS — I5032 Chronic diastolic (congestive) heart failure: Secondary | ICD-10-CM | POA: Diagnosis not present

## 2018-09-04 DIAGNOSIS — A419 Sepsis, unspecified organism: Secondary | ICD-10-CM | POA: Diagnosis not present

## 2018-09-04 DIAGNOSIS — J690 Pneumonitis due to inhalation of food and vomit: Secondary | ICD-10-CM | POA: Diagnosis not present

## 2018-09-04 DIAGNOSIS — J9621 Acute and chronic respiratory failure with hypoxia: Secondary | ICD-10-CM | POA: Diagnosis not present

## 2018-09-04 NOTE — Progress Notes (Addendum)
Pulmonary Critical Care Medicine [place]   PULMONARY CRITICAL CARE SERVICE  PROGRESS NOTE  Date of Service: 09/04/2018  Tiffany Atkinson  WLN:[id number]  DOB: 02/15/1971   DOA: 08/04/2018  Referring Physician: Merton Border, MD  HPI: Tiffany Atkinson is a 48 y.o. female seen for follow up of Acute on Chronic Respiratory Failure.  Patient is on aerosol trach collar 28% FiO2 during the day weaning on the vent at night.  Medications: Reviewed on Rounds  Physical Exam:  Vitals: 27 respirations 20 BP 04/18/1952 O2 sat 100% temp 99.5  Ventilator Settings ATC 28%  . General: Comfortable at this time . Eyes: Grossly normal lids, irises & conjunctiva . ENT: grossly tongue is normal . Neck: no obvious mass . Cardiovascular: S1 S2 normal no gallop . Respiratory: No rales or rhonchi noted . Abdomen: soft . Skin: no rash seen on limited exam . Musculoskeletal: not rigid . Psychiatric:unable to assess . Neurologic: no seizure no involuntary movements         Lab Data:   Basic Metabolic Panel: Recent Labs  Lab 08/29/18 0950 08/30/18 0742 09/02/18 0859 09/03/18 0544  NA 137 135 139  --   K 3.2* 3.4* 2.2* 3.7  CL 99 100 104  --   CO2 28 27 27   --   GLUCOSE 93 104* 99  --   BUN 14 10 21*  --   CREATININE <0.30* <0.30* <0.30*  --   CALCIUM 8.4* 8.6* 8.5*  --   MG  --  1.8 1.6*  --   PHOS  --  4.2 4.1  --     ABG: No results for input(s): PHART, PCO2ART, PO2ART, HCO3, O2SAT in the last 168 hours.  Liver Function Tests: Recent Labs  Lab 08/30/18 0742  ALBUMIN 2.2*   No results for input(s): LIPASE, AMYLASE in the last 168 hours. No results for input(s): AMMONIA in the last 168 hours.  CBC: Recent Labs  Lab 08/29/18 0950 08/30/18 0742  WBC 5.7 6.6  HGB 8.6* 8.8*  HCT 26.9* 27.6*  MCV 88.5 88.2  PLT 288 354    Cardiac Enzymes: No results for input(s): CKTOTAL, CKMB, CKMBINDEX, TROPONINI in the last 168 hours.  BNP (last 3 results) No  results for input(s): BNP in the last 8760 hours.  ProBNP (last 3 results) No results for input(s): PROBNP in the last 8760 hours.  Radiological Exams: No results found.  Assessment/Plan Active Problems:   Acute on chronic respiratory failure with hypoxia (HCC)   Aspiration pneumonia due to gastric secretions (HCC)   MMN (multifocal motor neuropathy) (HCC)   Severe sepsis (HCC)   Diastolic dysfunction with chronic heart failure ([place])   1. Acute on chronic respiratory failure with hypoxia continue with T collar trials and titrate oxygen per protocol. Continue pulmonary toilet at this time. 2. Aspiration pneumonia continue supportive care 3. Multifocal motor neuropathy unchanged continue to rest on the ventilator at night 4. Severe sepsis resolved 5. Stomach dysfunction compensated   I have personally seen and evaluated the patient, evaluated laboratory and imaging results, formulated the assessment and plan and placed orders. The Patient requires high complexity decision making for assessment and support.  Case was discussed on Rounds with the Respiratory Therapy Staff  Allyne Gee, MD [facility] Pulmonary Critical Care Medicine Sleep Medicine

## 2018-09-05 DIAGNOSIS — I5032 Chronic diastolic (congestive) heart failure: Secondary | ICD-10-CM | POA: Diagnosis not present

## 2018-09-05 DIAGNOSIS — J690 Pneumonitis due to inhalation of food and vomit: Secondary | ICD-10-CM | POA: Diagnosis not present

## 2018-09-05 DIAGNOSIS — J9621 Acute and chronic respiratory failure with hypoxia: Secondary | ICD-10-CM | POA: Diagnosis not present

## 2018-09-05 DIAGNOSIS — A419 Sepsis, unspecified organism: Secondary | ICD-10-CM | POA: Diagnosis not present

## 2018-09-05 LAB — PHOSPHORUS: Phosphorus: 3.8 mg/dL (ref 2.5–4.6)

## 2018-09-05 LAB — MAGNESIUM: Magnesium: 1.7 mg/dL (ref 1.7–2.4)

## 2018-09-05 LAB — BASIC METABOLIC PANEL
Anion gap: 10 (ref 5–15)
BUN: 11 mg/dL (ref 6–20)
CO2: 27 mmol/L (ref 22–32)
Calcium: 8.7 mg/dL — ABNORMAL LOW (ref 8.9–10.3)
Chloride: 103 mmol/L (ref 98–111)
Creatinine, Ser: <0.3 mg/dL — ABNORMAL LOW (ref 0.44–1.00)
Glucose, Bld: 109 mg/dL — ABNORMAL HIGH (ref 70–99)
Potassium: 2.9 mmol/L — ABNORMAL LOW (ref 3.5–5.1)
Sodium: 140 mmol/L (ref 135–145)

## 2018-09-05 NOTE — Progress Notes (Addendum)
Pulmonary Critical Care Medicine [place]   PULMONARY CRITICAL CARE SERVICE  PROGRESS NOTE  Date of Service: 09/05/2018  CARRYE GOLLER  [id number]:[id number]  DOB: 10-15-1970   DOA: 08/04/2018  Referring Physician: Merton Border, MD  HPI: NAZARENE Atkinson is a 48 y.o. female seen for follow up of Acute on Chronic Respiratory Failure.  Patient remains on aerosol trach collar 28% FiO2 during the day and resting on the vent at night.  Medications: Reviewed on Rounds  Physical Exam:  Vitals: Pulse 101 respirations 18 BP 93/49 O2 sat 99% temp 96  Ventilator Settings 28% ATC  . General: Comfortable at this time . Eyes: Grossly normal lids, irises & conjunctiva . ENT: grossly tongue is normal . Neck: no obvious mass . Cardiovascular: S1 S2 normal no gallop . Respiratory: No rales or rhonchi noted . Abdomen: soft . Skin: no rash seen on limited exam . Musculoskeletal: not rigid . Psychiatric:unable to assess . Neurologic: no seizure no involuntary movements         Lab Data:   Basic Metabolic Panel: Recent Labs  Lab 08/30/18 0742 09/02/18 0859 09/03/18 0544 09/05/18 0750  NA 135 139  --  140  K 3.4* 2.2* 3.7 2.9*  CL 100 104  --  103  CO2 27 27  --  27  GLUCOSE 104* 99  --  109*  BUN 10 21*  --  11  CREATININE <0.30* <0.30*  --  <0.30*  CALCIUM 8.6* 8.5*  --  8.7*  MG 1.8 1.6*  --  1.7  PHOS 4.2 4.1  --  3.8    ABG: No results for input(s): PHART, PCO2ART, PO2ART, HCO3, O2SAT in the last 168 hours.  Liver Function Tests: Recent Labs  Lab 08/30/18 0742  ALBUMIN 2.2*   No results for input(s): LIPASE, AMYLASE in the last 168 hours. No results for input(s): AMMONIA in the last 168 hours.  CBC: Recent Labs  Lab 08/30/18 0742  WBC 6.6  HGB 8.8*  HCT 27.6*  MCV 88.2  PLT 354    Cardiac Enzymes: No results for input(s): CKTOTAL, CKMB, CKMBINDEX, TROPONINI in the last 168 hours.  BNP (last 3 results) No results for input(s): BNP in the  last 8760 hours.  ProBNP (last 3 results) No results for input(s): PROBNP in the last 8760 hours.  Radiological Exams: No results found.  Assessment/Plan Active Problems:   Acute on chronic respiratory failure with hypoxia (HCC)   Aspiration pneumonia due to gastric secretions (HCC)   MMN (multifocal motor neuropathy) (HCC)   Severe sepsis (HCC)   Diastolic dysfunction with chronic heart failure ([place])   1. Acute on chronic respiratory failure with hypoxia continue with T collar trials and titrate oxygen per protocol. Continue pulmonary toilet at this time. 2. Aspiration pneumonia continue supportive care 3. Multifocal motor neuropathy unchanged continue to rest on the ventilator at night 4. Severe sepsis resolved 5. Stomach dysfunction compensated   I have personally seen and evaluated the patient, evaluated laboratory and imaging results, formulated the assessment and plan and placed orders. The Patient requires high complexity decision making for assessment and support.  Case was discussed on Rounds with the Respiratory Therapy Staff  Allyne Gee, MD [facility] Pulmonary Critical Care Medicine Sleep Medicine

## 2018-09-06 DIAGNOSIS — A419 Sepsis, unspecified organism: Secondary | ICD-10-CM | POA: Diagnosis not present

## 2018-09-06 DIAGNOSIS — I5032 Chronic diastolic (congestive) heart failure: Secondary | ICD-10-CM | POA: Diagnosis not present

## 2018-09-06 DIAGNOSIS — J9621 Acute and chronic respiratory failure with hypoxia: Secondary | ICD-10-CM | POA: Diagnosis not present

## 2018-09-06 DIAGNOSIS — J690 Pneumonitis due to inhalation of food and vomit: Secondary | ICD-10-CM | POA: Diagnosis not present

## 2018-09-06 LAB — POTASSIUM: Potassium: 3.9 mmol/L (ref 3.5–5.1)

## 2018-09-06 NOTE — Progress Notes (Signed)
Pulmonary Critical Care Medicine [place]   PULMONARY CRITICAL CARE SERVICE  PROGRESS NOTE  Date of Service: 09/06/2018  Tiffany Atkinson  HBZ:[id number]  DOB: [birth year]   DOA: 08/04/2018  Referring Physician: Merton Border, MD  HPI: Tiffany Atkinson is a 48 y.o. female seen for follow up of Acute on Chronic Respiratory Failure.  Patient currently is on T collar has been on 28% FiO2  Medications: Reviewed on Rounds  Physical Exam:  Vitals: Temperature 97.5 pulse 113 respiratory rate 27 blood pressure one 4/65 saturations 100%  Ventilator Settings on T collar at this time  . General: Comfortable at this time . Eyes: Grossly normal lids, irises & conjunctiva . ENT: grossly tongue is normal . Neck: no obvious mass . Cardiovascular: S1 S2 normal no gallop . Respiratory: No rhonchi or rales are noted . Abdomen: soft . Skin: no rash seen on limited exam . Musculoskeletal: not rigid . Psychiatric:unable to assess . Neurologic: no seizure no involuntary movements         Lab Data:   Basic Metabolic Panel: Recent Labs  Lab 09/02/18 0859 09/03/18 0544 09/05/18 0750 09/06/18 0721  NA 139  --  140  --   K 2.2* 3.7 2.9* 3.9  CL 104  --  103  --   CO2 27  --  27  --   GLUCOSE 99  --  109*  --   BUN 21*  --  11  --   CREATININE <0.30*  --  <0.30*  --   CALCIUM 8.5*  --  8.7*  --   MG 1.6*  --  1.7  --   PHOS 4.1  --  3.8  --     ABG: No results for input(s): PHART, PCO2ART, PO2ART, HCO3, O2SAT in the last 168 hours.  Liver Function Tests: No results for input(s): AST, ALT, ALKPHOS, BILITOT, PROT, ALBUMIN in the last 168 hours. No results for input(s): LIPASE, AMYLASE in the last 168 hours. No results for input(s): AMMONIA in the last 168 hours.  CBC: No results for input(s): WBC, NEUTROABS, HGB, HCT, MCV, PLT in the last 168 hours.  Cardiac Enzymes: No results for input(s): CKTOTAL, CKMB, CKMBINDEX, TROPONINI in the last 168 hours.  BNP (last 3  results) No results for input(s): BNP in the last 8760 hours.  ProBNP (last 3 results) No results for input(s): PROBNP in the last 8760 hours.  Radiological Exams: No results found.  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 Acute on chronic respiratory failure hypoxia we will continue with T collar trials continue secretion management pulmonary toilet. 2. Aspiration pneumonia treated clinically is at baseline 3. Multiple focal motor neuropathy patient off the vent during the day on the vent at night 4. Diastolic dysfunction at baseline 5. Severe sepsis resolved   I have personally seen and evaluated the patient, evaluated laboratory and imaging results, formulated the assessment and plan and placed orders. The Patient requires high complexity decision making for assessment and support.  Case was discussed on Rounds with the Respiratory Therapy Staff  Allyne Gee, MD [facility] Pulmonary Critical Care Medicine Sleep Medicine

## 2018-09-07 DIAGNOSIS — J690 Pneumonitis due to inhalation of food and vomit: Secondary | ICD-10-CM | POA: Diagnosis not present

## 2018-09-07 DIAGNOSIS — A419 Sepsis, unspecified organism: Secondary | ICD-10-CM | POA: Diagnosis not present

## 2018-09-07 DIAGNOSIS — J9621 Acute and chronic respiratory failure with hypoxia: Secondary | ICD-10-CM | POA: Diagnosis not present

## 2018-09-07 DIAGNOSIS — I5032 Chronic diastolic (congestive) heart failure: Secondary | ICD-10-CM | POA: Diagnosis not present

## 2018-09-07 LAB — BASIC METABOLIC PANEL
Anion gap: 8 (ref 5–15)
BUN: 22 mg/dL — ABNORMAL HIGH (ref 6–20)
CO2: 28 mmol/L (ref 22–32)
Calcium: 8.7 mg/dL — ABNORMAL LOW (ref 8.9–10.3)
Chloride: 100 mmol/L (ref 98–111)
Creatinine, Ser: <0.3 mg/dL — ABNORMAL LOW (ref 0.44–1.00)
Glucose, Bld: 106 mg/dL — ABNORMAL HIGH (ref 70–99)
Potassium: 4.5 mmol/L (ref 3.5–5.1)
Sodium: 136 mmol/L (ref 135–145)

## 2018-09-07 LAB — MAGNESIUM: Magnesium: 1.8 mg/dL (ref 1.7–2.4)

## 2018-09-07 LAB — TRIGLYCERIDES: Triglycerides: 109 mg/dL (ref <150)

## 2018-09-07 NOTE — Progress Notes (Signed)
Pulmonary Critical Care Medicine [place]   PULMONARY CRITICAL CARE SERVICE  PROGRESS NOTE  Date of Service: 09/07/2018  Tiffany Atkinson  [id number]:[id number]  DOB: 1971-01-31   DOA: 08/04/2018  Referring Physician: Merton Border, MD  HPI: Tiffany Atkinson is a 48 y.o. female seen for follow up of Acute on Chronic Respiratory Failure.  Patient is on T collar off the ventilator comfortable without distress  Medications: Reviewed on Rounds  Physical Exam:  Vitals: Temperature 98.7 pulse 106 respiratory 27 blood pressure 107/47 saturations 100%  Ventilator Settings off the ventilator right now on T collar  . General: Comfortable at this time . Eyes: Grossly normal lids, irises & conjunctiva . ENT: grossly tongue is normal . Neck: no obvious mass . Cardiovascular: S1 S2 normal no gallop . Respiratory: No rhonchi rales are noted . Abdomen: soft . Skin: no rash seen on limited exam . Musculoskeletal: not rigid . Psychiatric:unable to assess . Neurologic: no seizure no involuntary movements         Lab Data:   Basic Metabolic Panel: Recent Labs  Lab 09/02/18 0859 09/03/18 0544 09/05/18 0750 09/06/18 0721  NA 139  --  140  --   K 2.2* 3.7 2.9* 3.9  CL 104  --  103  --   CO2 27  --  27  --   GLUCOSE 99  --  109*  --   BUN 21*  --  11  --   CREATININE <0.30*  --  <0.30*  --   CALCIUM 8.5*  --  8.7*  --   MG 1.6*  --  1.7  --   PHOS 4.1  --  3.8  --     ABG: No results for input(s): PHART, PCO2ART, PO2ART, HCO3, O2SAT in the last 168 hours.  Liver Function Tests: No results for input(s): AST, ALT, ALKPHOS, BILITOT, PROT, ALBUMIN in the last 168 hours. No results for input(s): LIPASE, AMYLASE in the last 168 hours. No results for input(s): AMMONIA in the last 168 hours.  CBC: No results for input(s): WBC, NEUTROABS, HGB, HCT, MCV, PLT in the last 168 hours.  Cardiac Enzymes: No results for input(s): CKTOTAL, CKMB, CKMBINDEX, TROPONINI in the last 168  hours.  BNP (last 3 results) No results for input(s): BNP in the last 8760 hours.  ProBNP (last 3 results) No results for input(s): PROBNP in the last 8760 hours.  Radiological Exams: No results found.  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 Acute on chronic respiratory failure hypoxia continue T collar trials as tolerated continue aggressive pulmonary toilet. 2. Aspiration pneumonia treated clinically improving we will follow 3. Multifocal motor neuropathy unchanged 4. Severe sepsis resolved 5. Diastolic dysfunction compensated we will continue to monitor   I have personally seen and evaluated the patient, evaluated laboratory and imaging results, formulated the assessment and plan and placed orders. The Patient requires high complexity decision making for assessment and support.  Case was discussed on Rounds with the Respiratory Therapy Staff  Allyne Gee, MD [facility] Pulmonary Critical Care Medicine Sleep Medicine

## 2018-09-08 ENCOUNTER — Other Ambulatory Visit (HOSPITAL_COMMUNITY): Payer: Medicaid Other

## 2018-09-08 DIAGNOSIS — J9621 Acute and chronic respiratory failure with hypoxia: Secondary | ICD-10-CM | POA: Diagnosis not present

## 2018-09-08 DIAGNOSIS — I5032 Chronic diastolic (congestive) heart failure: Secondary | ICD-10-CM | POA: Diagnosis not present

## 2018-09-08 DIAGNOSIS — A419 Sepsis, unspecified organism: Secondary | ICD-10-CM | POA: Diagnosis not present

## 2018-09-08 DIAGNOSIS — J690 Pneumonitis due to inhalation of food and vomit: Secondary | ICD-10-CM | POA: Diagnosis not present

## 2018-09-08 LAB — BASIC METABOLIC PANEL
Anion gap: 9 (ref 5–15)
BUN: 20 mg/dL (ref 6–20)
CO2: 27 mmol/L (ref 22–32)
Calcium: 8.5 mg/dL — ABNORMAL LOW (ref 8.9–10.3)
Chloride: 102 mmol/L (ref 98–111)
Creatinine, Ser: <0.3 mg/dL — ABNORMAL LOW (ref 0.44–1.00)
Glucose, Bld: 101 mg/dL — ABNORMAL HIGH (ref 70–99)
Potassium: 3.3 mmol/L — ABNORMAL LOW (ref 3.5–5.1)
Sodium: 138 mmol/L (ref 135–145)

## 2018-09-08 LAB — PHOSPHORUS: Phosphorus: 3.5 mg/dL (ref 2.5–4.6)

## 2018-09-08 NOTE — Progress Notes (Addendum)
Pulmonary Critical Care Medicine [place]   PULMONARY CRITICAL CARE SERVICE  PROGRESS NOTE  Date of Service: 09/08/2018  ROSENDA GEFFRARD  KVQ:[id number]  DOB: 1971/03/16   DOA: 08/04/2018  Referring Physician: Merton Border, MD  HPI: Tiffany Atkinson is a 48 y.o. female seen for follow up of Acute on Chronic Respiratory Failure.  Patient remains on aerosol trach collar 28% during the day and resting on the ventilator at night.  Satting well with no distress.  Medications: Reviewed on Rounds  Physical Exam:  Vitals: Pulse 113 respirations 13 BP 120/66 O2 sat 98% temp 98.0  Ventilator Settings ATC 28%  . General: Comfortable at this time . Eyes: Grossly normal lids, irises & conjunctiva . ENT: grossly tongue is normal . Neck: no obvious mass . Cardiovascular: S1 S2 normal no gallop . Respiratory: No rales rhonchi noted . Abdomen: soft . Skin: no rash seen on limited exam . Musculoskeletal: not rigid . Psychiatric:unable to assess . Neurologic: no seizure no involuntary movements         Lab Data:   Basic Metabolic Panel: Recent Labs  Lab 09/02/18 0859 09/03/18 0544 09/05/18 0750 09/06/18 0721 09/07/18 1138 09/08/18 0554  NA 139  --  140  --  136 138  K 2.2* 3.7 2.9* 3.9 4.5 3.3*  CL 104  --  103  --  100 102  CO2 27  --  27  --  28 27  GLUCOSE 99  --  109*  --  106* 101*  BUN 21*  --  11  --  22* 20  CREATININE <0.30*  --  <0.30*  --  <0.30* <0.30*  CALCIUM 8.5*  --  8.7*  --  8.7* 8.5*  MG 1.6*  --  1.7  --  1.8  --   PHOS 4.1  --  3.8  --   --  3.5    ABG: No results for input(s): PHART, PCO2ART, PO2ART, HCO3, O2SAT in the last 168 hours.  Liver Function Tests: No results for input(s): AST, ALT, ALKPHOS, BILITOT, PROT, ALBUMIN in the last 168 hours. No results for input(s): LIPASE, AMYLASE in the last 168 hours. No results for input(s): AMMONIA in the last 168 hours.  CBC: No results for input(s): WBC, NEUTROABS, HGB, HCT, MCV, PLT  in the last 168 hours.  Cardiac Enzymes: No results for input(s): CKTOTAL, CKMB, CKMBINDEX, TROPONINI in the last 168 hours.  BNP (last 3 results) No results for input(s): BNP in the last 8760 hours.  ProBNP (last 3 results) No results for input(s): PROBNP in the last 8760 hours.  Radiological Exams: No results found.  Assessment/Plan Active Problems:   Acute on chronic respiratory failure with hypoxia (HCC)   Aspiration pneumonia due to gastric secretions (HCC)   MMN (multifocal motor neuropathy) (HCC)   Severe sepsis (HCC)   Diastolic dysfunction with chronic heart failure ([place])   1. Acute on chronic respiratory failure hypoxia continue T collar trials as tolerated continue aggressive pulmonary toilet. 2. Aspiration pneumonia treated clinically improving we will follow 3. Multifocal motor neuropathy unchanged 4. Severe sepsis resolved 5. Diastolic dysfunction compensated we will continue to monitor   I have personally seen and evaluated the patient, evaluated laboratory and imaging results, formulated the assessment and plan and placed orders. The Patient requires high complexity decision making for assessment and support.  Case was discussed on Rounds with the Respiratory Therapy Staff  Allyne Gee, MD [facility] Pulmonary Critical Care Medicine  Sleep Medicine

## 2018-09-09 ENCOUNTER — Other Ambulatory Visit (HOSPITAL_COMMUNITY): Payer: Medicaid Other

## 2018-09-09 DIAGNOSIS — I5032 Chronic diastolic (congestive) heart failure: Secondary | ICD-10-CM | POA: Diagnosis not present

## 2018-09-09 DIAGNOSIS — J9621 Acute and chronic respiratory failure with hypoxia: Secondary | ICD-10-CM | POA: Diagnosis not present

## 2018-09-09 DIAGNOSIS — A419 Sepsis, unspecified organism: Secondary | ICD-10-CM | POA: Diagnosis not present

## 2018-09-09 DIAGNOSIS — J690 Pneumonitis due to inhalation of food and vomit: Secondary | ICD-10-CM | POA: Diagnosis not present

## 2018-09-09 MED ORDER — IOHEXOL 300 MG/ML  SOLN
50.0000 mL | Freq: Once | INTRAMUSCULAR | Status: AC | PRN
  Administered 2018-09-09: 50 mL via ORAL

## 2018-09-09 NOTE — Progress Notes (Signed)
[facility]   PULMONARY CRITICAL CARE SERVICE  PROGRESS NOTE  Date of Service: 09/09/2018  Tiffany Applebaumngela A Cavitt  WUJ:[id number]  DOB: Mar 07, 1971   DOA: 08/04/2018  Referring Physician: Carron CurieAli Hijazi, MD  HPI: Tiffany Atkinson is a 48 y.o. female seen for follow up of Acute on Chronic Respiratory Failure.  Patient is on T collar right now 28% FiO2  Medications: Reviewed on Rounds  Physical Exam:  Vitals: Temperature 99.3 pulse 99 respiratory 15 blood pressure 142/80 saturations 100%  Ventilator Settings patient is on T collar right now on 28% FiO2  . General: Comfortable at this time . Eyes: Grossly normal lids, irises & conjunctiva . ENT: grossly tongue is normal . Neck: no obvious mass . Cardiovascular: S1 S2 normal no gallop . Respiratory: No rhonchi no rales are noted . Abdomen: soft . Skin: no rash seen on limited exam . Musculoskeletal: not rigid . Psychiatric:unable to assess . Neurologic: no seizure no involuntary movements         Lab Data:   Basic Metabolic Panel: Recent Labs  Lab 09/03/18 0544 09/05/18 0750 09/06/18 0721 09/07/18 1138 09/08/18 0554  NA  --  140  --  136 138  K 3.7 2.9* 3.9 4.5 3.3*  CL  --  103  --  100 102  CO2  --  27  --  28 27  GLUCOSE  --  109*  --  106* 101*  BUN  --  11  --  22* 20  CREATININE  --  <0.30*  --  <0.30* <0.30*  CALCIUM  --  8.7*  --  8.7* 8.5*  MG  --  1.7  --  1.8  --   PHOS  --  3.8  --   --  3.5    ABG: No results for input(s): PHART, PCO2ART, PO2ART, HCO3, O2SAT in the last 168 hours.  Liver Function Tests: No results for input(s): AST, ALT, ALKPHOS, BILITOT, PROT, ALBUMIN in the last 168 hours. No results for input(s): LIPASE, AMYLASE in the last 168 hours. No results for input(s): AMMONIA in the last 168 hours.  CBC: No results for input(s): WBC, NEUTROABS, HGB, HCT, MCV, PLT in the last 168 hours.  Cardiac Enzymes: No results for input(s): CKTOTAL,  CKMB, CKMBINDEX, TROPONINI in the last 168 hours.  BNP (last 3 results) No results for input(s): BNP in the last 8760 hours.  ProBNP (last 3 results) No results for input(s): PROBNP in the last 8760 hours.  Radiological Exams: Ct Abdomen Pelvis W Contrast  Result Date: 09/08/2018 CLINICAL DATA:  Small-bowel obstruction EXAM: CT ABDOMEN AND PELVIS WITH CONTRAST TECHNIQUE: Multidetector CT imaging of the abdomen and pelvis was performed using the standard protocol following bolus administration of intravenous contrast. CONTRAST:  100mL OMNIPAQUE IOHEXOL 300 MG/ML  SOLN COMPARISON:  Abdominal radiograph 09/01/2018 FINDINGS: LOWER CHEST: There is no basilar pleural or apical pericardial effusion. HEPATOBILIARY: The hepatic contours and density are normal. There is no intra- or extrahepatic biliary dilatation. Status post cholecystectomy. PANCREAS: The pancreatic parenchymal contours are normal and there is no ductal dilatation. There is no peripancreatic fluid collection. SPLEEN: Normal. ADRENALS/URINARY TRACT: --Adrenal glands: Normal. --Right kidney/ureter: No hydronephrosis, nephroureterolithiasis, perinephric stranding or solid renal mass. --Left kidney/ureter: No hydronephrosis, nephroureterolithiasis, perinephric stranding or solid renal mass. --Urinary bladder: Normal for degree of distention STOMACH/BOWEL: --Stomach/Duodenum: There is what appears to be a gastrostomy catheter in the left upper quadrant, but the catheter tubing is extraluminal. --  Small bowel: No dilatation or inflammation. --Colon: No focal abnormality.  There is fluid in the rectum. --Appendix: Normal. VASCULAR/LYMPHATIC: Normal course and caliber of the major abdominal vessels. No abdominal or pelvic lymphadenopathy. REPRODUCTIVE: Status post hysterectomy. No adnexal mass. MUSCULOSKELETAL. No bony spinal canal stenosis or focal osseous abnormality. OTHER: None. IMPRESSION: 1. No dilated small bowel to indicate small bowel  obstruction. 2. Apparent extraluminal positioning of gastrostomy tube. Consider correlation with contrast injection and abdominal radiography. Electronically Signed   By: Ulyses Jarred M.D.   On: 09/08/2018 22:02   Dg Abdomen Peg Tube Location  Result Date: 09/09/2018 CLINICAL DATA:  Feeding tube dysfunction. EXAM: ABDOMEN - 1 VIEW COMPARISON:  CT abdomen pelvis from yesterday. Abdominal x-ray dated September 01, 2018. FINDINGS: Contrast injection through the gastrostomy tube demonstrates opacification of the stomach. No definite contrast leak. Unchanged mild diffuse small and large bowel gaseous distention. No radio-opaque calculi or other significant radiographic abnormality are seen. Contrast within the bladder. No acute osseous abnormality. IMPRESSION: 1. Contrast injection through the gastrostomy tube opacifies the stomach. No gross contrast leak. 2. Unchanged mild diffuse small and large bowel gaseous distension, suspicious for ileus. Electronically Signed   By: Titus Dubin M.D.   On: 09/09/2018 11:12    Assessment/Plan Active Problems:   Acute on chronic respiratory failure with hypoxia (HCC)   Aspiration pneumonia due to gastric secretions (HCC)   MMN (multifocal motor neuropathy) (HCC)   Severe sepsis (HCC)   Diastolic dysfunction with chronic heart failure ([place])   1. Acute on chronic respiratory failure with hypoxia patient is on T collar right now on 28% FiO2 tolerating the ventilator 2. Aspiration pneumonia treated we will continue with supportive care 3. Multifocal motor neuropathy at baseline 4. Severe sepsis resolved 5. Diastolic dysfunction compensated we will continue with supportive care   I have personally seen and evaluated the patient, evaluated laboratory and imaging results, formulated the assessment and plan and placed orders. The Patient requires high complexity decision making for assessment and support.  Case was discussed on Rounds with the Respiratory Therapy  Staff  Allyne Gee, MD [facility] Pulmonary Critical Care Medicine Sleep Medicine

## 2018-09-10 DIAGNOSIS — I5032 Chronic diastolic (congestive) heart failure: Secondary | ICD-10-CM | POA: Diagnosis not present

## 2018-09-10 DIAGNOSIS — A419 Sepsis, unspecified organism: Secondary | ICD-10-CM | POA: Diagnosis not present

## 2018-09-10 DIAGNOSIS — J9621 Acute and chronic respiratory failure with hypoxia: Secondary | ICD-10-CM | POA: Diagnosis not present

## 2018-09-10 DIAGNOSIS — J690 Pneumonitis due to inhalation of food and vomit: Secondary | ICD-10-CM | POA: Diagnosis not present

## 2018-09-10 LAB — CBC
HCT: 30.2 % — ABNORMAL LOW (ref 36.0–46.0)
Hemoglobin: 9.8 g/dL — ABNORMAL LOW (ref 12.0–15.0)
MCH: 28.1 pg (ref 26.0–34.0)
MCHC: 32.5 g/dL (ref 30.0–36.0)
MCV: 86.5 fL (ref 80.0–100.0)
Platelets: 387 10*3/uL (ref 150–400)
RBC: 3.49 MIL/uL — ABNORMAL LOW (ref 3.87–5.11)
RDW: 14.5 % (ref 11.5–15.5)
WBC: 7.1 10*3/uL (ref 4.0–10.5)
nRBC: 0 % (ref 0.0–0.2)

## 2018-09-10 LAB — BASIC METABOLIC PANEL
Anion gap: 10 (ref 5–15)
BUN: 10 mg/dL (ref 6–20)
CO2: 21 mmol/L — ABNORMAL LOW (ref 22–32)
Calcium: 8.5 mg/dL — ABNORMAL LOW (ref 8.9–10.3)
Chloride: 107 mmol/L (ref 98–111)
Creatinine, Ser: <0.3 mg/dL — ABNORMAL LOW (ref 0.44–1.00)
Glucose, Bld: 91 mg/dL (ref 70–99)
Potassium: 3.1 mmol/L — ABNORMAL LOW (ref 3.5–5.1)
Sodium: 138 mmol/L (ref 135–145)

## 2018-09-10 LAB — MAGNESIUM: Magnesium: 1.9 mg/dL (ref 1.7–2.4)

## 2018-09-10 NOTE — Progress Notes (Addendum)
Pulmonary Critical Care Medicine [facility]   PULMONARY CRITICAL CARE SERVICE  PROGRESS NOTE  Date of Service: 09/10/2018  Tiffany Applebaumngela A Adeyemi  ZOX:[id number]  DOB: 1970-07-25   DOA: 08/04/2018  Referring Physician: Carron CurieAli Hijazi, MD  HPI: Tiffany Atkinson is a [age] y.o. female seen for follow up of Acute on Chronic Respiratory Failure.  Patient remains on aerosol trach collar 28% FiO2 using PMV during the day and resting on the vent at night.  Satting well with no distress.  Medications: Reviewed on Rounds  Physical Exam:  Vitals: Pulse 89 respirations 12 BP 124/77 O2 sat 100% temp 98.9  Ventilator Settings ATC 28%  . General: Comfortable at this time . Eyes: Grossly normal lids, irises & conjunctiva . ENT: grossly tongue is normal . Neck: no obvious mass . Cardiovascular: S1 S2 normal no gallop . Respiratory: No rales or rhonchi noted . Abdomen: soft . Skin: no rash seen on limited exam . Musculoskeletal: not rigid . Psychiatric:unable to assess . Neurologic: no seizure no involuntary movements         Lab Data:   Basic Metabolic Panel: Recent Labs  Lab 09/05/18 0750 09/06/18 0721 09/07/18 1138 09/08/18 0554 09/10/18 0601  NA 140  --  136 138 138  K 2.9* 3.9 4.5 3.3* 3.1*  CL 103  --  100 102 107  CO2 27  --  28 27 21*  GLUCOSE 109*  --  106* 101* 91  BUN 11  --  22* 20 10  CREATININE <0.30*  --  <0.30* <0.30* <0.30*  CALCIUM 8.7*  --  8.7* 8.5* 8.5*  MG 1.7  --  1.8  --  1.9  PHOS 3.8  --   --  3.5  --     ABG: No results for input(s): PHART, PCO2ART, PO2ART, HCO3, O2SAT in the last 168 hours.  Liver Function Tests: No results for input(s): AST, ALT, ALKPHOS, BILITOT, PROT, ALBUMIN in the last 168 hours. No results for input(s): LIPASE, AMYLASE in the last 168 hours. No results for input(s): AMMONIA in the last 168 hours.  CBC: Recent Labs  Lab 09/10/18 0601  WBC 7.1  HGB 9.8*  HCT 30.2*  MCV 86.5  PLT 387    Cardiac  Enzymes: No results for input(s): CKTOTAL, CKMB, CKMBINDEX, TROPONINI in the last 168 hours.  BNP (last 3 results) No results for input(s): BNP in the last 8760 hours.  ProBNP (last 3 results) No results for input(s): PROBNP in the last 8760 hours.  Radiological Exams: Ct Abdomen Pelvis W Contrast  Result Date: 09/08/2018 CLINICAL DATA:  Small-bowel obstruction EXAM: CT ABDOMEN AND PELVIS WITH CONTRAST TECHNIQUE: Multidetector CT imaging of the abdomen and pelvis was performed using the standard protocol following bolus administration of intravenous contrast. CONTRAST:  100mL OMNIPAQUE IOHEXOL 300 MG/ML  SOLN COMPARISON:  Abdominal radiograph 09/01/2018 FINDINGS: LOWER CHEST: There is no basilar pleural or apical pericardial effusion. HEPATOBILIARY: The hepatic contours and density are normal. There is no intra- or extrahepatic biliary dilatation. Status post cholecystectomy. PANCREAS: The pancreatic parenchymal contours are normal and there is no ductal dilatation. There is no peripancreatic fluid collection. SPLEEN: Normal. ADRENALS/URINARY TRACT: --Adrenal glands: Normal. --Right kidney/ureter: No hydronephrosis, nephroureterolithiasis, perinephric stranding or solid renal mass. --Left kidney/ureter: No hydronephrosis, nephroureterolithiasis, perinephric stranding or solid renal mass. --Urinary bladder: Normal for degree of distention STOMACH/BOWEL: --Stomach/Duodenum: There is what appears to be a gastrostomy catheter in the left upper quadrant, but the catheter tubing is extraluminal. --  Small bowel: No dilatation or inflammation. --Colon: No focal abnormality.  There is fluid in the rectum. --Appendix: Normal. VASCULAR/LYMPHATIC: Normal course and caliber of the major abdominal vessels. No abdominal or pelvic lymphadenopathy. REPRODUCTIVE: Status post hysterectomy. No adnexal mass. MUSCULOSKELETAL. No bony spinal canal stenosis or focal osseous abnormality. OTHER: None. IMPRESSION: 1. No dilated  small bowel to indicate small bowel obstruction. 2. Apparent extraluminal positioning of gastrostomy tube. Consider correlation with contrast injection and abdominal radiography. Electronically Signed   By: Ulyses Jarred M.D.   On: 09/08/2018 22:02   Dg Abdomen Peg Tube Location  Result Date: 09/09/2018 CLINICAL DATA:  Feeding tube dysfunction. EXAM: ABDOMEN - 1 VIEW COMPARISON:  CT abdomen pelvis from yesterday. Abdominal x-ray dated September 01, 2018. FINDINGS: Contrast injection through the gastrostomy tube demonstrates opacification of the stomach. No definite contrast leak. Unchanged mild diffuse small and large bowel gaseous distention. No radio-opaque calculi or other significant radiographic abnormality are seen. Contrast within the bladder. No acute osseous abnormality. IMPRESSION: 1. Contrast injection through the gastrostomy tube opacifies the stomach. No gross contrast leak. 2. Unchanged mild diffuse small and large bowel gaseous distension, suspicious for ileus. Electronically Signed   By: Titus Dubin M.D.   On: 09/09/2018 11:12    Assessment/Plan Active Problems:   Acute on chronic respiratory failure with hypoxia (HCC)   Aspiration pneumonia due to gastric secretions (HCC)   MMN (multifocal motor neuropathy) (HCC)   Severe sepsis (HCC)   Diastolic dysfunction with chronic heart failure ([place])   1. Acute on chronic respiratory failure with hypoxia patient continues on aerosol trach collar during the day resting on the vent at night.  Continue supportive measures and Manera toilet. 2. -Pneumonia treated continue supportive care 3. Multifocal motor neuropathy at baseline 4. Severe sepsis resolved 5. Diastolic dysfunction compensated continue supportive care   I have personally seen and evaluated the patient, evaluated laboratory and imaging results, formulated the assessment and plan and placed orders. The Patient requires high complexity decision making for assessment and support.   Case was discussed on Rounds with the Respiratory Therapy Staff  Allyne Gee, MD [facility] Pulmonary Critical Care Medicine Sleep Medicine

## 2018-09-11 DIAGNOSIS — A419 Sepsis, unspecified organism: Secondary | ICD-10-CM | POA: Diagnosis not present

## 2018-09-11 DIAGNOSIS — J9621 Acute and chronic respiratory failure with hypoxia: Secondary | ICD-10-CM | POA: Diagnosis not present

## 2018-09-11 DIAGNOSIS — I5032 Chronic diastolic (congestive) heart failure: Secondary | ICD-10-CM | POA: Diagnosis not present

## 2018-09-11 DIAGNOSIS — J690 Pneumonitis due to inhalation of food and vomit: Secondary | ICD-10-CM | POA: Diagnosis not present

## 2018-09-11 LAB — PHOSPHORUS: Phosphorus: 3.4 mg/dL (ref 2.5–4.6)

## 2018-09-11 NOTE — Progress Notes (Addendum)
Pulmonary Critical Care Medicine [place]   PULMONARY CRITICAL CARE SERVICE  PROGRESS NOTE  Date of Service: 09/11/2018  KONSTANCE HAPPEL  ZOX:[id number]  DOB: September 07, 1970   DOA: 08/04/2018  Referring Physician: Merton Border, MD  HPI: LAGUANA DESAUTEL is a 48 y.o. female seen for follow up of Acute on Chronic Respiratory Failure.  Patient continues to rest on a trach collar 28% FiO2 during the day using PMV.  At night rest on the ventilator currently satting well with no fever or distress noted.  Medications: Reviewed on Rounds  Physical Exam:  Vitals: Pulse 109 respirations 35 BP 124/41 O2 sat 98% temp 97.4  Ventilator Settings ATC 28%  . General: Comfortable at this time . Eyes: Grossly normal lids, irises & conjunctiva . ENT: grossly tongue is normal . Neck: no obvious mass . Cardiovascular: S1 S2 normal no gallop . Respiratory: No rales or rhonchi noted . Abdomen: soft . Skin: no rash seen on limited exam . Musculoskeletal: not rigid . Psychiatric:unable to assess . Neurologic: no seizure no involuntary movements         Lab Data:   Basic Metabolic Panel: Recent Labs  Lab 09/05/18 0750 09/06/18 0721 09/07/18 1138 09/08/18 0554 09/10/18 0601 09/11/18 0550  NA 140  --  136 138 138  --   K 2.9* 3.9 4.5 3.3* 3.1*  --   CL 103  --  100 102 107  --   CO2 27  --  28 27 21*  --   GLUCOSE 109*  --  106* 101* 91  --   BUN 11  --  22* 20 10  --   CREATININE <0.30*  --  <0.30* <0.30* <0.30*  --   CALCIUM 8.7*  --  8.7* 8.5* 8.5*  --   MG 1.7  --  1.8  --  1.9  --   PHOS 3.8  --   --  3.5  --  3.4    ABG: No results for input(s): PHART, PCO2ART, PO2ART, HCO3, O2SAT in the last 168 hours.  Liver Function Tests: No results for input(s): AST, ALT, ALKPHOS, BILITOT, PROT, ALBUMIN in the last 168 hours. No results for input(s): LIPASE, AMYLASE in the last 168 hours. No results for input(s): AMMONIA in the last 168 hours.  CBC: Recent Labs  Lab  09/10/18 0601  WBC 7.1  HGB 9.8*  HCT 30.2*  MCV 86.5  PLT 387    Cardiac Enzymes: No results for input(s): CKTOTAL, CKMB, CKMBINDEX, TROPONINI in the last 168 hours.  BNP (last 3 results) No results for input(s): BNP in the last 8760 hours.  ProBNP (last 3 results) No results for input(s): PROBNP in the last 8760 hours.  Radiological Exams: No results found.  Assessment/Plan Active Problems:   Acute on chronic respiratory failure with hypoxia (HCC)   Aspiration pneumonia due to gastric secretions (HCC)   MMN (multifocal motor neuropathy) (HCC)   Severe sepsis (HCC)   Diastolic dysfunction with chronic heart failure ([place])   1. Acute on chronic respiratory failure with hypoxia patient continues on aerosol trach collar during the day resting on the vent at night.  Continue supportive measures and Manera toilet. 2. Pneumonia treated continue supportive care 3. Multifocal motor neuropathy at baseline 4. Severe sepsis resolved 5. Diastolic dysfunction compensated continue supportive care   I have personally seen and evaluated the patient, evaluated laboratory and imaging results, formulated the assessment and plan and placed orders. The Patient requires high  complexity decision making for assessment and support.  Case was discussed on Rounds with the Respiratory Therapy Staff  Allyne Gee, MD [facility] Pulmonary Critical Care Medicine Sleep Medicine

## 2018-09-12 ENCOUNTER — Other Ambulatory Visit (HOSPITAL_COMMUNITY): Payer: Medicaid Other

## 2018-09-12 DIAGNOSIS — I5032 Chronic diastolic (congestive) heart failure: Secondary | ICD-10-CM | POA: Diagnosis not present

## 2018-09-12 DIAGNOSIS — A419 Sepsis, unspecified organism: Secondary | ICD-10-CM | POA: Diagnosis not present

## 2018-09-12 DIAGNOSIS — J690 Pneumonitis due to inhalation of food and vomit: Secondary | ICD-10-CM | POA: Diagnosis not present

## 2018-09-12 DIAGNOSIS — J9621 Acute and chronic respiratory failure with hypoxia: Secondary | ICD-10-CM | POA: Diagnosis not present

## 2018-09-12 NOTE — Progress Notes (Addendum)
Pulmonary Critical Care Medicine [place]   PULMONARY CRITICAL CARE SERVICE  PROGRESS NOTE  Date of Service: 09/12/2018  Tiffany Atkinson  GXQ:[id number]  DOB: [birth year]   DOA: 08/04/2018  Referring Physician: Merton Border, MD  HPI: Tiffany Atkinson is a 48 y.o. female seen for follow up of Acute on Chronic Respiratory Failure.  Patient remains on aerosol trach collar 28% FiO2 during the day and resting on the vent at night.  Satting well currently with no distress.  Medications: Reviewed on Rounds  Physical Exam:  Vitals: Pulse 103 respirations 22 BP 10 over 82 O2 sat 100% temp 99.8  Ventilator Settings AC 28%  . General: Comfortable at this time . Eyes: Grossly normal lids, irises & conjunctiva . ENT: grossly tongue is normal . Neck: no obvious mass . Cardiovascular: S1 S2 normal no gallop . Respiratory: No rales or rhonchi noted . Abdomen: soft . Skin: no rash seen on limited exam . Musculoskeletal: not rigid . Psychiatric:unable to assess . Neurologic: no seizure no involuntary movements         Lab Data:   Basic Metabolic Panel: Recent Labs  Lab 09/06/18 0721 09/07/18 1138 09/08/18 0554 09/10/18 0601 09/11/18 0550  NA  --  136 138 138  --   K 3.9 4.5 3.3* 3.1*  --   CL  --  100 102 107  --   CO2  --  28 27 21*  --   GLUCOSE  --  106* 101* 91  --   BUN  --  22* 20 10  --   CREATININE  --  <0.30* <0.30* <0.30*  --   CALCIUM  --  8.7* 8.5* 8.5*  --   MG  --  1.8  --  1.9  --   PHOS  --   --  3.5  --  3.4    ABG: No results for input(s): PHART, PCO2ART, PO2ART, HCO3, O2SAT in the last 168 hours.  Liver Function Tests: No results for input(s): AST, ALT, ALKPHOS, BILITOT, PROT, ALBUMIN in the last 168 hours. No results for input(s): LIPASE, AMYLASE in the last 168 hours. No results for input(s): AMMONIA in the last 168 hours.  CBC: Recent Labs  Lab 09/10/18 0601  WBC 7.1  HGB 9.8*  HCT 30.2*  MCV 86.5  PLT 387    Cardiac  Enzymes: No results for input(s): CKTOTAL, CKMB, CKMBINDEX, TROPONINI in the last 168 hours.  BNP (last 3 results) No results for input(s): BNP in the last 8760 hours.  ProBNP (last 3 results) No results for input(s): PROBNP in the last 8760 hours.  Radiological Exams: Dg Abd 1 View  Result Date: 09/12/2018 CLINICAL DATA:  Follow-up ileus. EXAM: ABDOMEN - 1 VIEW COMPARISON:  September 09, 2018 FINDINGS: Air-filled prominent loops of large and small bowel are stable, suggesting ileus. There is a small amount of gas in the rectum. No free air, portal venous gas, or pneumatosis. No other abnormalities. IMPRESSION: Prominent air-filled loops of large and small bowel consistent with ileus, unchanged. Electronically Signed   By: Dorise Bullion III M.D   On: 09/12/2018 08:51    Assessment/Plan Active Problems:   Acute on chronic respiratory failure with hypoxia ([place])   Aspiration pneumonia due to gastric secretions (HCC)   MMN (multifocal motor neuropathy) (HCC)   Severe sepsis (HCC)   Diastolic dysfunction with chronic heart failure ([place])   1. Acute on chronic respiratory failure with hypoxia patient continues on aerosol  trach collar during the day resting on the vent at night. Continue supportive measures and Manera toilet. 2. Pneumonia treated continue supportive care 3. Multifocal motor neuropathy at baseline 4. Severe sepsis resolved 5. Diastolic dysfunction compensated continue supportive care   I have personally seen and evaluated the patient, evaluated laboratory and imaging results, formulated the assessment and plan and placed orders. The Patient requires high complexity decision making for assessment and support.  Case was discussed on Rounds with the Respiratory Therapy Staff  Yevonne PaxSaadat A Khan, MD [facility] Pulmonary Critical Care Medicine Sleep Medicine

## 2018-09-13 DIAGNOSIS — I5032 Chronic diastolic (congestive) heart failure: Secondary | ICD-10-CM | POA: Diagnosis not present

## 2018-09-13 DIAGNOSIS — A419 Sepsis, unspecified organism: Secondary | ICD-10-CM | POA: Diagnosis not present

## 2018-09-13 DIAGNOSIS — J690 Pneumonitis due to inhalation of food and vomit: Secondary | ICD-10-CM | POA: Diagnosis not present

## 2018-09-13 DIAGNOSIS — J9621 Acute and chronic respiratory failure with hypoxia: Secondary | ICD-10-CM | POA: Diagnosis not present

## 2018-09-13 LAB — BASIC METABOLIC PANEL
Anion gap: 10 (ref 5–15)
BUN: 32 mg/dL — ABNORMAL HIGH (ref 6–20)
CO2: 25 mmol/L (ref 22–32)
Calcium: 8.8 mg/dL — ABNORMAL LOW (ref 8.9–10.3)
Chloride: 106 mmol/L (ref 98–111)
Creatinine, Ser: 0.3 mg/dL — ABNORMAL LOW (ref 0.44–1.00)
GFR calc Af Amer: >60 mL/min (ref >60)
GFR calc non Af Amer: >60 mL/min (ref >60)
Glucose, Bld: 102 mg/dL — ABNORMAL HIGH (ref 70–99)
Potassium: 3.6 mmol/L (ref 3.5–5.1)
Sodium: 141 mmol/L (ref 135–145)

## 2018-09-13 LAB — PHOSPHORUS: Phosphorus: 3.9 mg/dL (ref 2.5–4.6)

## 2018-09-13 LAB — MAGNESIUM: Magnesium: 1.9 mg/dL (ref 1.7–2.4)

## 2018-09-13 NOTE — Progress Notes (Addendum)
Pulmonary Critical Care Medicine [place]   PULMONARY CRITICAL CARE SERVICE  PROGRESS NOTE  Date of Service: 09/13/2018  Tiffany Atkinson  [id number]  DOB: 1970-11-09   DOA: 08/04/2018  Referring Physician: Merton Border, MD  HPI: Tiffany Atkinson is a 48 y.o. female seen for follow up of Acute on Chronic Respiratory Failure.  Patient remains on aerosol trach collar 28% FiO2 using PMV during the day and resting on the vent at night.  Satting well no distress currently.  Medications: Reviewed on Rounds  Physical Exam:  Vitals: Pulse 113 BP 126/75 O2 sat 100% temp 98.3  Ventilator Settings ATC 20%  . General: Comfortable at this time . Eyes: Grossly normal lids, irises & conjunctiva . ENT: grossly tongue is normal . Neck: no obvious mass . Cardiovascular: S1 S2 normal no gallop . Respiratory: No rales or rhonchi noted . Abdomen: soft . Skin: no rash seen on limited exam . Musculoskeletal: not rigid . Psychiatric:unable to assess . Neurologic: no seizure no involuntary movements         Lab Data:   Basic Metabolic Panel: Recent Labs  Lab 09/07/18 1138 09/08/18 0554 09/10/18 0601 09/11/18 0550 09/13/18 0631  NA 136 138 138  --  141  K 4.5 3.3* 3.1*  --  3.6  CL 100 102 107  --  106  CO2 28 27 21*  --  25  GLUCOSE 106* 101* 91  --  102*  BUN 22* 20 10  --  32*  CREATININE <0.30* <0.30* <0.30*  --  0.30*  CALCIUM 8.7* 8.5* 8.5*  --  8.8*  MG 1.8  --  1.9  --  1.9  PHOS  --  3.5  --  3.4 3.9    ABG: No results for input(s): PHART, PCO2ART, PO2ART, HCO3, O2SAT in the last 168 hours.  Liver Function Tests: No results for input(s): AST, ALT, ALKPHOS, BILITOT, PROT, ALBUMIN in the last 168 hours. No results for input(s): LIPASE, AMYLASE in the last 168 hours. No results for input(s): AMMONIA in the last 168 hours.  CBC: Recent Labs  Lab 09/10/18 0601  WBC 7.1  HGB 9.8*  HCT 30.2*  MCV 86.5  PLT 387    Cardiac Enzymes: No results  for input(s): CKTOTAL, CKMB, CKMBINDEX, TROPONINI in the last 168 hours.  BNP (last 3 results) No results for input(s): BNP in the last 8760 hours.  ProBNP (last 3 results) No results for input(s): PROBNP in the last 8760 hours.  Radiological Exams: Dg Abd 1 View  Result Date: 09/12/2018 CLINICAL DATA:  Follow-up ileus. EXAM: ABDOMEN - 1 VIEW COMPARISON:  September 09, 2018 FINDINGS: Air-filled prominent loops of large and small bowel are stable, suggesting ileus. There is a small amount of gas in the rectum. No free air, portal venous gas, or pneumatosis. No other abnormalities. IMPRESSION: Prominent air-filled loops of large and small bowel consistent with ileus, unchanged. Electronically Signed   By: Dorise Bullion III M.D   On: 09/12/2018 08:51    Assessment/Plan Active Problems:   Acute on chronic respiratory failure with hypoxia ([place])   Aspiration pneumonia due to gastric secretions (HCC)   MMN (multifocal motor neuropathy) (HCC)   Severe sepsis (HCC)   Diastolic dysfunction with chronic heart failure ([place])   1. Acute on chronic respiratory failure with hypoxia patient continues on aerosol trach collar during the day resting on the vent at night. Continue supportive measures and Manera toilet. 2. Pneumonia treated continue  supportive care 3. Multifocal motor neuropathy at baseline 4. Severe sepsis resolved 5. Diastolic dysfunction compensated continue supportive care   I have personally seen and evaluated the patient, evaluated laboratory and imaging results, formulated the assessment and plan and placed orders. The Patient requires high complexity decision making for assessment and support.  Case was discussed on Rounds with the Respiratory Therapy Staff  Yevonne PaxSaadat A Khan, MD [facility] Pulmonary Critical Care Medicine Sleep Medicine

## 2018-09-14 DIAGNOSIS — J690 Pneumonitis due to inhalation of food and vomit: Secondary | ICD-10-CM | POA: Diagnosis not present

## 2018-09-14 DIAGNOSIS — J9621 Acute and chronic respiratory failure with hypoxia: Secondary | ICD-10-CM | POA: Diagnosis not present

## 2018-09-14 DIAGNOSIS — I5032 Chronic diastolic (congestive) heart failure: Secondary | ICD-10-CM | POA: Diagnosis not present

## 2018-09-14 DIAGNOSIS — A419 Sepsis, unspecified organism: Secondary | ICD-10-CM | POA: Diagnosis not present

## 2018-09-14 LAB — TRIGLYCERIDES: Triglycerides: 119 mg/dL (ref <150)

## 2018-09-14 LAB — PHOSPHORUS: Phosphorus: 2.7 mg/dL (ref 2.5–4.6)

## 2018-09-14 NOTE — Progress Notes (Addendum)
Pulmonary Critical Care Medicine [place]   PULMONARY CRITICAL CARE SERVICE  PROGRESS NOTE  Date of Service: 09/14/2018  Tiffany Atkinson  [id number]:[id number]  DOB: 22-Oct-1970   DOA: 08/04/2018  Referring Physician: Merton Border, MD  HPI: Tiffany Atkinson is a 48 y.o. female seen for follow up of Acute on Chronic Respiratory Failure.  Patient continues on aerosol trach collar 28% FiO2 during the day and resting on the ventilator at night.  Medications: Reviewed on Rounds  Physical Exam:  Vitals: Pulse 101 respirations 12 BP 99/57 O2 sat 100% temp 97.0  Ventilator Settings ATC 28%  . General: Comfortable at this time . Eyes: Grossly normal lids, irises & conjunctiva . ENT: grossly tongue is normal . Neck: no obvious mass . Cardiovascular: S1 S2 normal no gallop . Respiratory: No rales or rhonchi noted . Abdomen: soft . Skin: no rash seen on limited exam . Musculoskeletal: not rigid . Psychiatric:unable to assess . Neurologic: no seizure no involuntary movements         Lab Data:   Basic Metabolic Panel: Recent Labs  Lab 09/08/18 0554 09/10/18 0601 09/11/18 0550 09/13/18 0631 09/14/18 0622  NA 138 138  --  141  --   K 3.3* 3.1*  --  3.6  --   CL 102 107  --  106  --   CO2 27 21*  --  25  --   GLUCOSE 101* 91  --  102*  --   BUN 20 10  --  32*  --   CREATININE <0.30* <0.30*  --  0.30*  --   CALCIUM 8.5* 8.5*  --  8.8*  --   MG  --  1.9  --  1.9  --   PHOS 3.5  --  3.4 3.9 2.7    ABG: No results for input(s): PHART, PCO2ART, PO2ART, HCO3, O2SAT in the last 168 hours.  Liver Function Tests: No results for input(s): AST, ALT, ALKPHOS, BILITOT, PROT, ALBUMIN in the last 168 hours. No results for input(s): LIPASE, AMYLASE in the last 168 hours. No results for input(s): AMMONIA in the last 168 hours.  CBC: Recent Labs  Lab 09/10/18 0601  WBC 7.1  HGB 9.8*  HCT 30.2*  MCV 86.5  PLT 387    Cardiac Enzymes: No results for input(s):  CKTOTAL, CKMB, CKMBINDEX, TROPONINI in the last 168 hours.  BNP (last 3 results) No results for input(s): BNP in the last 8760 hours.  ProBNP (last 3 results) No results for input(s): PROBNP in the last 8760 hours.  Radiological Exams: No results found.  Assessment/Plan Active Problems:   Acute on chronic respiratory failure with hypoxia (HCC)   Aspiration pneumonia due to gastric secretions (HCC)   MMN (multifocal motor neuropathy) (HCC)   Severe sepsis (HCC)   Diastolic dysfunction with chronic heart failure ([place])   1. Acute on chronic respiratory failure with hypoxia patient continues on aerosol trach collar during the day resting on the vent at night. Continue supportive measures and Manera toilet. 2. Pneumonia treated continue supportive care 3. Multifocal motor neuropathy at baseline 4. Severe sepsis resolved 5. Diastolic dysfunction compensated continue supportive care   I have personally seen and evaluated the patient, evaluated laboratory and imaging results, formulated the assessment and plan and placed orders. The Patient requires high complexity decision making for assessment and support.  Case was discussed on Rounds with the Respiratory Therapy Staff  Allyne Gee, MD [facility] Pulmonary Critical Care Medicine  Sleep Medicine

## 2018-09-15 DIAGNOSIS — I5032 Chronic diastolic (congestive) heart failure: Secondary | ICD-10-CM | POA: Diagnosis not present

## 2018-09-15 DIAGNOSIS — J9621 Acute and chronic respiratory failure with hypoxia: Secondary | ICD-10-CM | POA: Diagnosis not present

## 2018-09-15 DIAGNOSIS — J690 Pneumonitis due to inhalation of food and vomit: Secondary | ICD-10-CM | POA: Diagnosis not present

## 2018-09-15 DIAGNOSIS — A419 Sepsis, unspecified organism: Secondary | ICD-10-CM | POA: Diagnosis not present

## 2018-09-15 LAB — BASIC METABOLIC PANEL
Anion gap: 11 (ref 5–15)
BUN: 37 mg/dL — ABNORMAL HIGH (ref 6–20)
CO2: 23 mmol/L (ref 22–32)
Calcium: 8.9 mg/dL (ref 8.9–10.3)
Chloride: 106 mmol/L (ref 98–111)
Creatinine, Ser: 0.32 mg/dL — ABNORMAL LOW (ref 0.44–1.00)
GFR calc Af Amer: >60 mL/min (ref >60)
GFR calc non Af Amer: >60 mL/min (ref >60)
Glucose, Bld: 92 mg/dL (ref 70–99)
Potassium: 3.4 mmol/L — ABNORMAL LOW (ref 3.5–5.1)
Sodium: 140 mmol/L (ref 135–145)

## 2018-09-15 NOTE — Progress Notes (Addendum)
Pulmonary Critical Care Medicine [place]   PULMONARY CRITICAL CARE SERVICE  PROGRESS NOTE  Date of Service: 09/15/2018  Tiffany Atkinson  AOZ:[id number]  DOB: 27-Apr-1970   DOA: 08/04/2018  Referring Physician: Merton Border, MD  HPI: Tiffany Atkinson is a 48 y.o. female seen for follow up of Acute on Chronic Respiratory Failure.  Patient continues on aerosol trach collar 28% FiO2 satting well.  Continues to be on pressure control and vent at night.  Medications: Reviewed on Rounds  Physical Exam:  Vitals: Pulse 1 4 respiration 23 BP 104/61 O2 sat 1% temp 97.6  Ventilator Settings ATC 28%  . General: Comfortable at this time . Eyes: Grossly normal lids, irises & conjunctiva . ENT: grossly tongue is normal . Neck: no obvious mass . Cardiovascular: S1 S2 normal no gallop . Respiratory: No rales rhonchi noted . Abdomen: soft . Skin: no rash seen on limited exam . Musculoskeletal: not rigid . Psychiatric:unable to assess . Neurologic: no seizure no involuntary movements         Lab Data:   Basic Metabolic Panel: Recent Labs  Lab 09/10/18 0601 09/11/18 0550 09/13/18 0631 09/14/18 0622 09/15/18 0732  NA 138  --  141  --  140  K 3.1*  --  3.6  --  3.4*  CL 107  --  106  --  106  CO2 21*  --  25  --  23  GLUCOSE 91  --  102*  --  92  BUN 10  --  32*  --  37*  CREATININE <0.30*  --  0.30*  --  0.32*  CALCIUM 8.5*  --  8.8*  --  8.9  MG 1.9  --  1.9  --   --   PHOS  --  3.4 3.9 2.7  --     ABG: No results for input(s): PHART, PCO2ART, PO2ART, HCO3, O2SAT in the last 168 hours.  Liver Function Tests: No results for input(s): AST, ALT, ALKPHOS, BILITOT, PROT, ALBUMIN in the last 168 hours. No results for input(s): LIPASE, AMYLASE in the last 168 hours. No results for input(s): AMMONIA in the last 168 hours.  CBC: Recent Labs  Lab 09/10/18 0601  WBC 7.1  HGB 9.8*  HCT 30.2*  MCV 86.5  PLT 387    Cardiac Enzymes: No results for input(s):  CKTOTAL, CKMB, CKMBINDEX, TROPONINI in the last 168 hours.  BNP (last 3 results) No results for input(s): BNP in the last 8760 hours.  ProBNP (last 3 results) No results for input(s): PROBNP in the last 8760 hours.  Radiological Exams: No results found.  Assessment/Plan Active Problems:   Acute on chronic respiratory failure with hypoxia (HCC)   Aspiration pneumonia due to gastric secretions (HCC)   MMN (multifocal motor neuropathy) (HCC)   Severe sepsis (HCC)   Diastolic dysfunction with chronic heart failure ([place])   1. Acute on chronic respiratory failure with hypoxia patient continues on aerosol trach collar during the day resting on the vent at night. Continue supportive measures and Man[place] toilet. 2. Pneumonia treated continue supportive care 3. Multifocal motor neuropathy at baseline 4. Severe sepsis resolved 5. Diastolic dysfunction compensated continue supportive care   I have personally seen and evaluated the patient, evaluated laboratory and imaging results, formulated the assessment and plan and placed orders. The Patient requires high complexity decision making for assessment and support.  Case was discussed on Rounds with the Respiratory Therapy Staff  Allyne Gee, MD  [facility] Pulmonary Critical Care Medicine Sleep Medicine

## 2018-09-16 DIAGNOSIS — I5032 Chronic diastolic (congestive) heart failure: Secondary | ICD-10-CM | POA: Diagnosis not present

## 2018-09-16 DIAGNOSIS — J9621 Acute and chronic respiratory failure with hypoxia: Secondary | ICD-10-CM | POA: Diagnosis not present

## 2018-09-16 DIAGNOSIS — A419 Sepsis, unspecified organism: Secondary | ICD-10-CM | POA: Diagnosis not present

## 2018-09-16 DIAGNOSIS — J690 Pneumonitis due to inhalation of food and vomit: Secondary | ICD-10-CM | POA: Diagnosis not present

## 2018-09-16 LAB — BASIC METABOLIC PANEL
Anion gap: 8 (ref 5–15)
BUN: 31 mg/dL — ABNORMAL HIGH (ref 6–20)
CO2: 26 mmol/L (ref 22–32)
Calcium: 8.6 mg/dL — ABNORMAL LOW (ref 8.9–10.3)
Chloride: 104 mmol/L (ref 98–111)
Creatinine, Ser: <0.3 mg/dL — ABNORMAL LOW (ref 0.44–1.00)
Glucose, Bld: 110 mg/dL — ABNORMAL HIGH (ref 70–99)
Potassium: 4.2 mmol/L (ref 3.5–5.1)
Sodium: 138 mmol/L (ref 135–145)

## 2018-09-16 LAB — NOVEL CORONAVIRUS, NAA (HOSP ORDER, SEND-OUT TO REF LAB; TAT 18-24 HRS): SARS-CoV-2, NAA: NOT DETECTED

## 2018-09-16 NOTE — Progress Notes (Addendum)
Pulmonary Critical Care Medicine [place]   PULMONARY CRITICAL CARE SERVICE  PROGRESS NOTE  Date of Service: 09/16/2018  Tiffany Atkinson  [id number]  DOB: 02-Aug-1970   DOA: 08/04/2018  Referring Physician: Merton Border, MD  HPI: Tiffany Atkinson is a 48 y.o. female seen for follow up of Acute on Chronic Respiratory Failure.  Patient continues on trach collar during the day resting on the vent at night with full support.  Satting well currently with no distress noted.  Medications: Reviewed on Rounds  Physical Exam:  Vitals: Pulse 94 respirations 16 BP 117/73 O2 sat on percent temp 97.5  Ventilator Settings ATC 28%  . General: Comfortable at this time . Eyes: Grossly normal lids, irises & conjunctiva . ENT: grossly tongue is normal . Neck: no obvious mass . Cardiovascular: S1 S2 normal no gallop . Respiratory: No rales or rhonchi noted . Abdomen: soft . Skin: no rash seen on limited exam . Musculoskeletal: not rigid . Psychiatric:unable to assess . Neurologic: no seizure no involuntary movements         Lab Data:   Basic Metabolic Panel: Recent Labs  Lab 09/10/18 0601 09/11/18 0550 09/13/18 0631 09/14/18 0622 09/15/18 0732 09/16/18 1335  NA 138  --  141  --  140 138  K 3.1*  --  3.6  --  3.4* 4.2  CL 107  --  106  --  106 104  CO2 21*  --  25  --  23 26  GLUCOSE 91  --  102*  --  92 110*  BUN 10  --  32*  --  37* 31*  CREATININE <0.30*  --  0.30*  --  0.32* <0.30*  CALCIUM 8.5*  --  8.8*  --  8.9 8.6*  MG 1.9  --  1.9  --   --   --   PHOS  --  3.4 3.9 2.7  --   --     ABG: No results for input(s): PHART, PCO2ART, PO2ART, HCO3, O2SAT in the last 168 hours.  Liver Function Tests: No results for input(s): AST, ALT, ALKPHOS, BILITOT, PROT, ALBUMIN in the last 168 hours. No results for input(s): LIPASE, AMYLASE in the last 168 hours. No results for input(s): AMMONIA in the last 168 hours.  CBC: Recent Labs  Lab 09/10/18 0601  WBC  7.1  HGB 9.8*  HCT 30.2*  MCV 86.5  PLT 387    Cardiac Enzymes: No results for input(s): CKTOTAL, CKMB, CKMBINDEX, TROPONINI in the last 168 hours.  BNP (last 3 results) No results for input(s): BNP in the last 8760 hours.  ProBNP (last 3 results) No results for input(s): PROBNP in the last 8760 hours.  Radiological Exams: No results found.  Assessment/Plan Active Problems:   Acute on chronic respiratory failure with hypoxia (HCC)   Aspiration pneumonia due to gastric secretions (HCC)   MMN (multifocal motor neuropathy) (HCC)   Severe sepsis (HCC)   Diastolic dysfunction with chronic heart failure ([place])   1. Acute on chronic respiratory failure with hypoxia patient continues on aerosol trach collar during the day resting on the vent at night. Continue supportive measures and Manera toilet. 2. Pneumonia treated continue supportive care 3. Multifocal motor neuropathy at baseline 4. Severe sepsis resolved 5. Diastolic dysfunction compensated continue supportive care   I have personally seen and evaluated the patient, evaluated laboratory and imaging results, formulated the assessment and plan and placed orders. The Patient requires high complexity decision  making for assessment and support.  Case was discussed on Rounds with the Respiratory Therapy Staff  Allyne Gee, MD [facility] Pulmonary Critical Care Medicine Sleep Medicine

## 2018-09-17 ENCOUNTER — Other Ambulatory Visit (HOSPITAL_COMMUNITY): Payer: Medicaid Other

## 2018-09-17 DIAGNOSIS — J690 Pneumonitis due to inhalation of food and vomit: Secondary | ICD-10-CM | POA: Diagnosis not present

## 2018-09-17 DIAGNOSIS — I5032 Chronic diastolic (congestive) heart failure: Secondary | ICD-10-CM | POA: Diagnosis not present

## 2018-09-17 DIAGNOSIS — J9621 Acute and chronic respiratory failure with hypoxia: Secondary | ICD-10-CM | POA: Diagnosis not present

## 2018-09-17 DIAGNOSIS — A419 Sepsis, unspecified organism: Secondary | ICD-10-CM | POA: Diagnosis not present

## 2018-09-17 LAB — BASIC METABOLIC PANEL
Anion gap: 10 (ref 5–15)
BUN: 32 mg/dL — ABNORMAL HIGH (ref 6–20)
CO2: 25 mmol/L (ref 22–32)
Calcium: 8.8 mg/dL — ABNORMAL LOW (ref 8.9–10.3)
Chloride: 103 mmol/L (ref 98–111)
Creatinine, Ser: <0.3 mg/dL — ABNORMAL LOW (ref 0.44–1.00)
Glucose, Bld: 99 mg/dL (ref 70–99)
Potassium: 3.7 mmol/L (ref 3.5–5.1)
Sodium: 138 mmol/L (ref 135–145)

## 2018-09-17 LAB — URINALYSIS, ROUTINE W REFLEX MICROSCOPIC
Bacteria, UA: NONE SEEN
Bilirubin Urine: NEGATIVE
Glucose, UA: NEGATIVE mg/dL
Hgb urine dipstick: NEGATIVE
Ketones, ur: NEGATIVE mg/dL
Leukocytes,Ua: NEGATIVE
Nitrite: NEGATIVE
Protein, ur: 30 mg/dL — AB
Specific Gravity, Urine: 1.03 (ref 1.005–1.030)
pH: 8 (ref 5.0–8.0)

## 2018-09-17 LAB — PHOSPHORUS: Phosphorus: 3.8 mg/dL (ref 2.5–4.6)

## 2018-09-17 LAB — CBC
HCT: 30.4 % — ABNORMAL LOW (ref 36.0–46.0)
Hemoglobin: 9.6 g/dL — ABNORMAL LOW (ref 12.0–15.0)
MCH: 28 pg (ref 26.0–34.0)
MCHC: 31.6 g/dL (ref 30.0–36.0)
MCV: 88.6 fL (ref 80.0–100.0)
Platelets: 402 10*3/uL — ABNORMAL HIGH (ref 150–400)
RBC: 3.43 MIL/uL — ABNORMAL LOW (ref 3.87–5.11)
RDW: 14.5 % (ref 11.5–15.5)
WBC: 9.8 10*3/uL (ref 4.0–10.5)
nRBC: 0 % (ref 0.0–0.2)

## 2018-09-17 NOTE — Progress Notes (Addendum)
[facility] Critical [facility]   PULMONARY CRITICAL CARE SERVICE  PROGRESS NOTE  Date of Service: 09/17/2018  Tiffany Atkinson  ZOX:[id number]  DOB: 12/03/1970   DOA: 08/04/2018  Referring Physician: Carron CurieAli Hijazi, MD  HPI: Tiffany Atkinson is a 48 y.o. female seen for follow up of Acute on Chronic Respiratory Failure.  Patient continues on aerosol trach collar 28% during the day and resting on the vent at night.  Satting well currently no distress.  Pulse 90 respirations 13 BP 104/61 O2 sat 100% temp 97.6  Medications: Reviewed on Rounds  Physical Exam:  Vitals: Pulse 90 respirations 13 BP 104/61 O2 sat 100% temp 97.6  Ventilator Settings ATC 28%  . General: Comfortable at this time . Eyes: Grossly normal lids, irises & conjunctiva . ENT: grossly tongue is normal . Neck: no obvious mass . Cardiovascular: S1 S2 normal no gallop . Respiratory: No rales or rhonchi noted . Abdomen: soft . Skin: no rash seen on limited exam . Musculoskeletal: not rigid . Psychiatric:unable to assess . Neurologic: no seizure no involuntary movements         Lab Data:   Basic Metabolic Panel: Recent Labs  Lab 09/11/18 0550 09/13/18 0631 09/14/18 0622 09/15/18 0732 09/16/18 1335 09/17/18 0511 09/17/18 1143  NA  --  141  --  140 138  --  138  K  --  3.6  --  3.4* 4.2  --  3.7  CL  --  106  --  106 104  --  103  CO2  --  25  --  23 26  --  25  GLUCOSE  --  102*  --  92 110*  --  99  BUN  --  32*  --  37* 31*  --  32*  CREATININE  --  0.30*  --  0.32* <0.30*  --  <0.30*  CALCIUM  --  8.8*  --  8.9 8.6*  --  8.8*  MG  --  1.9  --   --   --   --   --   PHOS 3.4 3.9 2.7  --   --  3.8  --     ABG: No results for input(s): PHART, PCO2ART, PO2ART, HCO3, O2SAT in the last 168 hours.  Liver Function Tests: No results for input(s): AST, ALT, ALKPHOS, BILITOT, PROT, ALBUMIN in the last 168 hours. No results for input(s): LIPASE, AMYLASE in the last 168  hours. No results for input(s): AMMONIA in the last 168 hours.  CBC: Recent Labs  Lab 09/17/18 1143  WBC 9.8  HGB 9.6*  HCT 30.4*  MCV 88.6  PLT 402*    Cardiac Enzymes: No results for input(s): CKTOTAL, CKMB, CKMBINDEX, TROPONINI in the last 168 hours.  BNP (last 3 results) No results for input(s): BNP in the last 8760 hours.  ProBNP (last 3 results) No results for input(s): PROBNP in the last 8760 hours.  Radiological Exams: Dg Abd 1 View  Result Date: 09/17/2018 CLINICAL DATA:  Ileus,abd pain /distention EXAM: ABDOMEN - 1 VIEW COMPARISON:  09/12/2018 FINDINGS: Multiple gas-filled mildly distended loops of small bowel and colon. The rectum is decompressed. Stable appearance since prior study. Regional bones unremarkable. Cholecystectomy clips. IMPRESSION: Stable small bowel and colonic gaseous distention Electronically Signed   By: Corlis Leak  Hassell M.D.   On: 09/17/2018 11:30   Dg Abdomen Peg Tube Location  Result Date: 09/17/2018 CLINICAL DATA:  Peg tube unable to flaush, pain  and discomfort EXAM: ABDOMEN - 1 VIEW COMPARISON:  09/17/2018, CT 09/08/2018 FINDINGS: Moderate diffuse gaseous dilatation of small and large bowel. Radiopaque rod to the right of the spine. Gastrostomy tube in the left mid to upper abdomen. Small amount of contrast visible in the fundus of the stomach. Moderate reflux of contrast to the mid to distal esophagus. IMPRESSION: 1. Small amount of radiopaque contrast in the gastric fundus. Reflux of contrast to the level of mid to distal esophagus. 2. No change in moderate diffuse gaseous dilatation of the bowel. Electronically Signed   By: Donavan Foil M.D.   On: 09/17/2018 17:37   Dg Chest Port 1 View  Result Date: 09/17/2018 CLINICAL DATA:  Sob,?aspiration hx trach EXAM: PORTABLE CHEST - 1 VIEW COMPARISON:  08/30/2018 FINDINGS: Tracheostomy stable. Right IJ central line has been removed. Lungs are clear. Heart size and mediastinal contours are within normal  limits. No effusion. Visualized bones unremarkable.  Cholecystectomy clips. IMPRESSION: No acute cardiopulmonary disease. Electronically Signed   By: Lucrezia Europe M.D.   On: 09/17/2018 11:29    Assessment/Plan Active Problems:   Acute on chronic respiratory failure with hypoxia (HCC)   Aspiration pneumonia due to gastric secretions (HCC)   MMN (multifocal motor neuropathy) (HCC)   Severe sepsis (HCC)   Diastolic dysfunction with chronic heart failure ([place])   1. Acute on chronic respiratory failure with hypoxia patient continues on aerosol trach collar during the day resting on the vent at night. Continue supportive measures and [place] toilet. 2. Pneumonia treated continue supportive care 3. Multifocal motor neuropathy at baseline 4. Severe sepsis resolved 5. Diastolic dysfunction compensated continue supportive care   I have personally seen and evaluated the patient, evaluated laboratory and imaging results, formulated the assessment and plan and placed orders. The Patient requires high complexity decision making for assessment and support.  Case was discussed on Rounds with the Respiratory Therapy Staff  Allyne Gee, MD [facility] Pulmonary Critical Care Medicine Sleep Medicine

## 2018-09-18 DIAGNOSIS — I5032 Chronic diastolic (congestive) heart failure: Secondary | ICD-10-CM | POA: Diagnosis not present

## 2018-09-18 DIAGNOSIS — J9621 Acute and chronic respiratory failure with hypoxia: Secondary | ICD-10-CM | POA: Diagnosis not present

## 2018-09-18 DIAGNOSIS — J690 Pneumonitis due to inhalation of food and vomit: Secondary | ICD-10-CM | POA: Diagnosis not present

## 2018-09-18 DIAGNOSIS — A419 Sepsis, unspecified organism: Secondary | ICD-10-CM | POA: Diagnosis not present

## 2018-09-18 LAB — URINE CULTURE: Culture: <10000 — AB

## 2018-09-18 NOTE — Progress Notes (Addendum)
Pulmonary Critical Care Medicine [place]   PULMONARY CRITICAL CARE SERVICE  PROGRESS NOTE  Date of Service: 09/18/2018  Tiffany Atkinson  [id number]:[id number]  DOB: March 13, 1971   DOA: 08/04/2018  Referring Physician: Merton Border, MD  HPI: Tiffany Atkinson is a 48 y.o. female seen for follow up of Acute on Chronic Respiratory Failure.  Patient was on aerosol trach collar 28% FiO2 using PMV without difficulty.  Medications: Reviewed on Rounds  Physical Exam:  Vitals: Pulse 98 respirations 29 BP 122/70 800% temp 97.1  Ventilator Settings ATC 28%  . General: Comfortable at this time . Eyes: Grossly normal lids, irises & conjunctiva . ENT: grossly tongue is normal . Neck: no obvious mass . Cardiovascular: S1 S2 normal no gallop . Respiratory: No rales rhonchi noted . Abdomen: soft . Skin: no rash seen on limited exam . Musculoskeletal: not rigid . Psychiatric:unable to assess . Neurologic: no seizure no involuntary movements         Lab Data:   Basic Metabolic Panel: Recent Labs  Lab 09/13/18 0631 09/14/18 0622 09/15/18 0732 09/16/18 1335 09/17/18 0511 09/17/18 1143  NA 141  --  140 138  --  138  K 3.6  --  3.4* 4.2  --  3.7  CL 106  --  106 104  --  103  CO2 25  --  23 26  --  25  GLUCOSE 102*  --  92 110*  --  99  BUN 32*  --  37* 31*  --  32*  CREATININE 0.30*  --  0.32* <0.30*  --  <0.30*  CALCIUM 8.8*  --  8.9 8.6*  --  8.8*  MG 1.9  --   --   --   --   --   PHOS 3.9 2.7  --   --  3.8  --     ABG: No results for input(s): PHART, PCO2ART, PO2ART, HCO3, O2SAT in the last 168 hours.  Liver Function Tests: No results for input(s): AST, ALT, ALKPHOS, BILITOT, PROT, ALBUMIN in the last 168 hours. No results for input(s): LIPASE, AMYLASE in the last 168 hours. No results for input(s): AMMONIA in the last 168 hours.  CBC: Recent Labs  Lab 09/17/18 1143  WBC 9.8  HGB 9.6*  HCT 30.4*  MCV 88.6  PLT 402*    Cardiac Enzymes: No results  for input(s): CKTOTAL, CKMB, CKMBINDEX, TROPONINI in the last 168 hours.  BNP (last 3 results) No results for input(s): BNP in the last 8760 hours.  ProBNP (last 3 results) No results for input(s): PROBNP in the last 8760 hours.  Radiological Exams: Dg Abd 1 View  Result Date: 09/17/2018 CLINICAL DATA:  Ileus,abd pain /distention EXAM: ABDOMEN - 1 VIEW COMPARISON:  09/12/2018 FINDINGS: Multiple gas-filled mildly distended loops of small bowel and colon. The rectum is decompressed. Stable appearance since prior study. Regional bones unremarkable. Cholecystectomy clips. IMPRESSION: Stable small bowel and colonic gaseous distention Electronically Signed   By: Lucrezia Europe M.D.   On: 09/17/2018 11:30   Dg Abdomen Peg Tube Location  Result Date: 09/17/2018 CLINICAL DATA:  Peg tube unable to flaush, pain and discomfort EXAM: ABDOMEN - 1 VIEW COMPARISON:  09/17/2018, CT 09/08/2018 FINDINGS: Moderate diffuse gaseous dilatation of small and large bowel. Radiopaque rod to the right of the spine. Gastrostomy tube in the left mid to upper abdomen. Small amount of contrast visible in the fundus of the stomach. Moderate reflux of contrast to the  mid to distal esophagus. IMPRESSION: 1. Small amount of radiopaque contrast in the gastric fundus. Reflux of contrast to the level of mid to distal esophagus. 2. No change in moderate diffuse gaseous dilatation of the bowel. Electronically Signed   By: Jasmine PangKim  Fujinaga M.D.   On: 09/17/2018 17:37   Dg Chest Port 1 View  Result Date: 09/17/2018 CLINICAL DATA:  Sob,?aspiration hx trach EXAM: PORTABLE CHEST - 1 VIEW COMPARISON:  08/30/2018 FINDINGS: Tracheostomy stable. Right IJ central line has been removed. Lungs are clear. Heart size and mediastinal contours are within normal limits. No effusion. Visualized bones unremarkable.  Cholecystectomy clips. IMPRESSION: No acute cardiopulmonary disease. Electronically Signed   By: Corlis Leak  Hassell M.D.   On: 09/17/2018 11:29     Assessment/Plan Active Problems:   Acute on chronic respiratory failure with hypoxia (HCC)   Aspiration pneumonia due to gastric secretions (HCC)   MMN (multifocal motor neuropathy) (HCC)   Severe sepsis (HCC)   Diastolic dysfunction with chronic heart failure (HCC)   1. Acute on chronic respiratory failure with hypoxia patient continues on aerosol trach collar during the day resting on the vent at night. Continue supportive measures and Manera toilet. 2. Pneumonia treated continue supportive care 3. Multifocal motor neuropathy at baseline 4. Severe sepsis resolved 5. Diastolic dysfunction compensated continue supportive care   I have personally seen and evaluated the patient, evaluated laboratory and imaging results, formulated the assessment and plan and placed orders. The Patient requires high complexity decision making for assessment and support.  Case was discussed on Rounds with the Respiratory Therapy Staff  Yevonne PaxSaadat A Vannesa Abair, MD [facility] Pulmonary Critical Care Medicine Sleep Medicine

## 2018-09-19 ENCOUNTER — Other Ambulatory Visit (HOSPITAL_COMMUNITY): Payer: Medicaid Other

## 2018-09-19 DIAGNOSIS — J9621 Acute and chronic respiratory failure with hypoxia: Secondary | ICD-10-CM | POA: Diagnosis not present

## 2018-09-19 DIAGNOSIS — I5032 Chronic diastolic (congestive) heart failure: Secondary | ICD-10-CM | POA: Diagnosis not present

## 2018-09-19 DIAGNOSIS — A419 Sepsis, unspecified organism: Secondary | ICD-10-CM | POA: Diagnosis not present

## 2018-09-19 DIAGNOSIS — J690 Pneumonitis due to inhalation of food and vomit: Secondary | ICD-10-CM | POA: Diagnosis not present

## 2018-09-19 MED ORDER — IOHEXOL 300 MG/ML  SOLN
50.0000 mL | Freq: Once | INTRAMUSCULAR | Status: AC | PRN
  Administered 2018-09-19: 40 mL

## 2018-09-19 NOTE — Progress Notes (Addendum)
Pulmonary Critical Care Medicine [place]   PULMONARY CRITICAL CARE SERVICE  PROGRESS NOTE  Date of Service: 09/19/2018  Tiffany Atkinson  DXI:[id number]  DOB: 1970-10-20   DOA: 08/04/2018  Referring Physician: Merton Border, MD  HPI: Tiffany Atkinson is a 48 y.o. female seen for follow up of Acute on Chronic Respiratory Failure.  Patient remains on aerosol trach collar 28% FiO2 using PMV without difficulty.  Resting on vent at night.  Medications: Reviewed on Rounds  Physical Exam:  Vitals: Pulse 109 respirations 19 BP 117/69 O2 sat 100% temp 98.2  Ventilator Settings ATC 28%  . General: Comfortable at this time . Eyes: Grossly normal lids, irises & conjunctiva . ENT: grossly tongue is normal . Neck: no obvious mass . Cardiovascular: S1 S2 normal no gallop . Respiratory: No rales rhonchi noted . Abdomen: soft . Skin: no rash seen on limited exam . Musculoskeletal: not rigid . Psychiatric:unable to assess . Neurologic: no seizure no involuntary movements         Lab Data:   Basic Metabolic Panel: Recent Labs  Lab 09/13/18 0631 09/14/18 0622 09/15/18 0732 09/16/18 1335 09/17/18 0511 09/17/18 1143  NA 141  --  140 138  --  138  K 3.6  --  3.4* 4.2  --  3.7  CL 106  --  106 104  --  103  CO2 25  --  23 26  --  25  GLUCOSE 102*  --  92 110*  --  99  BUN 32*  --  37* 31*  --  32*  CREATININE 0.30*  --  0.32* <0.30*  --  <0.30*  CALCIUM 8.8*  --  8.9 8.6*  --  8.8*  MG 1.9  --   --   --   --   --   PHOS 3.9 2.7  --   --  3.8  --     ABG: No results for input(s): PHART, PCO2ART, PO2ART, HCO3, O2SAT in the last 168 hours.  Liver Function Tests: No results for input(s): AST, ALT, ALKPHOS, BILITOT, PROT, ALBUMIN in the last 168 hours. No results for input(s): LIPASE, AMYLASE in the last 168 hours. No results for input(s): AMMONIA in the last 168 hours.  CBC: Recent Labs  Lab 09/17/18 1143  WBC 9.8  HGB 9.6*  HCT 30.4*  MCV 88.6  PLT  402*    Cardiac Enzymes: No results for input(s): CKTOTAL, CKMB, CKMBINDEX, TROPONINI in the last 168 hours.  BNP (last 3 results) No results for input(s): BNP in the last 8760 hours.  ProBNP (last 3 results) No results for input(s): PROBNP in the last 8760 hours.  Radiological Exams: Dg Abd 1 View  Result Date: 09/19/2018 CLINICAL DATA:  Peg tube placement. 40 mL Omnipaque 300 given and flushed with 50 mL water. EXAM: ABDOMEN - 1 VIEW COMPARISON:  09/17/2018 FINDINGS: Patient's percutaneous gastrostomy tube is present over the lateral aspect of the left mid to upper abdomen. Contrast is present within the tube. There is a moderate collection of contrast along the lateral margin of the left upper quadrant likely contrast extravasation indicating tube malposition. Multiple air-filled colonic loops with collection of contrast over the rectum. No dilated small bowel loops. No free peritoneal air. Remainder the exam is unchanged. IMPRESSION: Percutaneous gastrostomy tube over the left upper quadrant with injected contrast extravasated over the lateral margin of the left upper quadrant indicating tube malposition. Nonobstructive bowel gas pattern. Electronically Signed  By: Elberta Fortisaniel  Boyle M.D.   On: 09/19/2018 09:18    Assessment/Plan Active Problems:   Acute on chronic respiratory failure with hypoxia (HCC)   Aspiration pneumonia due to gastric secretions (HCC)   MMN (multifocal motor neuropathy) (HCC)   Severe sepsis (HCC)   Diastolic dysfunction with chronic heart failure (HCC)   1. Acute on chronic respiratory failure with hypoxia patient continues on aerosol trach collar during the day resting on the vent at night. Continue supportive measures and Manera toilet. 2. Pneumonia treated continue supportive care 3. Multifocal motor neuropathy at baseline 4. Severe sepsis resolved 5. Diastolic dysfunction compensated continue supportive care   I have personally seen and evaluated the  patient, evaluated laboratory and imaging results, formulated the assessment and plan and placed orders. The Patient requires high complexity decision making for assessment and support.  Case was discussed on Rounds with the Respiratory Therapy Staff  Yevonne PaxSaadat A Tahesha Skeet, MD [facility] Pulmonary Critical Care Medicine Sleep Medicine

## 2018-09-20 ENCOUNTER — Other Ambulatory Visit (HOSPITAL_COMMUNITY): Payer: Medicaid Other

## 2018-09-20 DIAGNOSIS — I5032 Chronic diastolic (congestive) heart failure: Secondary | ICD-10-CM | POA: Diagnosis not present

## 2018-09-20 DIAGNOSIS — A419 Sepsis, unspecified organism: Secondary | ICD-10-CM | POA: Diagnosis not present

## 2018-09-20 DIAGNOSIS — J690 Pneumonitis due to inhalation of food and vomit: Secondary | ICD-10-CM | POA: Diagnosis not present

## 2018-09-20 DIAGNOSIS — J9621 Acute and chronic respiratory failure with hypoxia: Secondary | ICD-10-CM | POA: Diagnosis not present

## 2018-09-20 LAB — CBC WITH DIFFERENTIAL/PLATELET
Abs Immature Granulocytes: 0.18 10*3/uL — ABNORMAL HIGH (ref 0.00–0.07)
Basophils Absolute: 0.1 10*3/uL (ref 0.0–0.1)
Basophils Relative: 0 %
Eosinophils Absolute: 0 10*3/uL (ref 0.0–0.5)
Eosinophils Relative: 0 %
HCT: 29.5 % — ABNORMAL LOW (ref 36.0–46.0)
Hemoglobin: 9.8 g/dL — ABNORMAL LOW (ref 12.0–15.0)
Immature Granulocytes: 1 %
Lymphocytes Relative: 8 %
Lymphs Abs: 1.5 10*3/uL (ref 0.7–4.0)
MCH: 28.2 pg (ref 26.0–34.0)
MCHC: 33.2 g/dL (ref 30.0–36.0)
MCV: 84.8 fL (ref 80.0–100.0)
Monocytes Absolute: 0.7 10*3/uL (ref 0.1–1.0)
Monocytes Relative: 4 %
Neutro Abs: 17.2 10*3/uL — ABNORMAL HIGH (ref 1.7–7.7)
Neutrophils Relative %: 87 %
Platelets: 382 10*3/uL (ref 150–400)
RBC: 3.48 MIL/uL — ABNORMAL LOW (ref 3.87–5.11)
RDW: 14.6 % (ref 11.5–15.5)
WBC: 19.7 10*3/uL — ABNORMAL HIGH (ref 4.0–10.5)
nRBC: 0 % (ref 0.0–0.2)

## 2018-09-20 LAB — COMPREHENSIVE METABOLIC PANEL
ALT: 60 U/L — ABNORMAL HIGH (ref 0–44)
AST: 50 U/L — ABNORMAL HIGH (ref 15–41)
Albumin: 1.9 g/dL — ABNORMAL LOW (ref 3.5–5.0)
Alkaline Phosphatase: 100 U/L (ref 38–126)
Anion gap: 9 (ref 5–15)
BUN: 25 mg/dL — ABNORMAL HIGH (ref 6–20)
CO2: 19 mmol/L — ABNORMAL LOW (ref 22–32)
Calcium: 8.1 mg/dL — ABNORMAL LOW (ref 8.9–10.3)
Chloride: 108 mmol/L (ref 98–111)
Creatinine, Ser: 0.68 mg/dL (ref 0.44–1.00)
GFR calc Af Amer: >60 mL/min (ref >60)
GFR calc non Af Amer: >60 mL/min (ref >60)
Glucose, Bld: 95 mg/dL (ref 70–99)
Potassium: 3 mmol/L — ABNORMAL LOW (ref 3.5–5.1)
Sodium: 136 mmol/L (ref 135–145)
Total Bilirubin: 0.5 mg/dL (ref 0.3–1.2)
Total Protein: 5.2 g/dL — ABNORMAL LOW (ref 6.5–8.1)

## 2018-09-20 LAB — LIPASE, BLOOD: Lipase: 19 U/L (ref 11–51)

## 2018-09-20 LAB — PHOSPHORUS: Phosphorus: 2.9 mg/dL (ref 2.5–4.6)

## 2018-09-20 LAB — AMYLASE: Amylase: 18 U/L — ABNORMAL LOW (ref 28–100)

## 2018-09-20 NOTE — Progress Notes (Signed)
Pulmonary Critical Care Medicine [place]   PULMONARY CRITICAL CARE SERVICE  PROGRESS NOTE  Date of Service: 09/20/2018  Tiffany Atkinson  OZD:[id number]  DOB: 1970-11-30   DOA: 08/04/2018  Referring Physician: Merton Border, MD  HPI: Tiffany Atkinson is a 48 y.o. female seen for follow up of Acute on Chronic Respiratory Failure.  Patient currently is on T collar doing fairly well.  Patient remains on the T collar during the daytime and on the ventilator at nighttime  Medications: Reviewed on Rounds  Physical Exam:  Vitals: Temperature 97.6 pulse 113 respiratory rate 17 blood pressure is 124/80 saturations 100%  Ventilator Settings off ventilator on T collar right now  . General: Comfortable at this time . Eyes: Grossly normal lids, irises & conjunctiva . ENT: grossly tongue is normal . Neck: no obvious mass . Cardiovascular: S1 S2 normal no gallop . Respiratory: No rhonchi coarse breath sounds . Abdomen: soft . Skin: no rash seen on limited exam . Musculoskeletal: not rigid . Psychiatric:unable to assess . Neurologic: no seizure no involuntary movements         Lab Data:   Basic Metabolic Panel: Recent Labs  Lab 09/14/18 0622 09/15/18 0732 09/16/18 1335 09/17/18 0511 09/17/18 1143 09/20/18 0744 09/20/18 1917  NA  --  140 138  --  138  --  136  K  --  3.4* 4.2  --  3.7  --  3.0*  CL  --  106 104  --  103  --  108  CO2  --  23 26  --  25  --  19*  GLUCOSE  --  92 110*  --  99  --  95  BUN  --  37* 31*  --  32*  --  25*  CREATININE  --  0.32* <0.30*  --  <0.30*  --  0.68  CALCIUM  --  8.9 8.6*  --  8.8*  --  8.1*  PHOS 2.7  --   --  3.8  --  2.9  --     ABG: No results for input(s): PHART, PCO2ART, PO2ART, HCO3, O2SAT in the last 168 hours.  Liver Function Tests: Recent Labs  Lab 09/20/18 1917  AST 50*  ALT 60*  ALKPHOS 100  BILITOT 0.5  PROT 5.2*  ALBUMIN 1.9*   Recent Labs  Lab 09/20/18 1917  LIPASE 19  AMYLASE 18*   No  results for input(s): AMMONIA in the last 168 hours.  CBC: Recent Labs  Lab 09/17/18 1143 09/20/18 0744  WBC 9.8 19.7*  NEUTROABS  --  17.2*  HGB 9.6* 9.8*  HCT 30.4* 29.5*  MCV 88.6 84.8  PLT 402* 382    Cardiac Enzymes: No results for input(s): CKTOTAL, CKMB, CKMBINDEX, TROPONINI in the last 168 hours.  BNP (last 3 results) No results for input(s): BNP in the last 8760 hours.  ProBNP (last 3 results) No results for input(s): PROBNP in the last 8760 hours.  Radiological Exams: Dg Abd 1 View  Result Date: 09/20/2018 CLINICAL DATA:  Left upper quadrant abdominal pain nausea and vomiting EXAM: ABDOMEN - 1 VIEW COMPARISON:  09/19/2018 FINDINGS: Gaseous distention of large bowel and possibly some small bowel loops as well contrast medium projecting in the lower pelvis is probably in the urinary bladder. From previous, and small bowel loops potentially measure up to about 4.0 cm in diameter. Cholecystectomy clips noted. IMPRESSION: 1. Continued gaseous distention of large bowel and some central abdominal  loops of small bowel, mildly increased from yesterday's exam. Electronically Signed   By: Gaylyn RongWalter  Liebkemann M.D.   On: 09/20/2018 18:16   Dg Abd 1 View  Result Date: 09/19/2018 CLINICAL DATA:  Fever abdomen pain EXAM: ABDOMEN - 1 VIEW COMPARISON:  09/19/2018, 09/17/2018, 09/12/2018 FINDINGS: No significant interval change in fairly extensive diffuse gaseous dilatation of small and large bowel. Radiopaque contrast within the rectum. Left upper quadrant gastrostomy tube. IMPRESSION: No significant interval change in prominent diffuse gaseous dilatation of bowel. Electronically Signed   By: Jasmine PangKim  Fujinaga M.D.   On: 09/19/2018 20:24   Dg Abd 1 View  Result Date: 09/19/2018 CLINICAL DATA:  Peg tube placement. 40 mL Omnipaque 300 given and flushed with 50 mL water. EXAM: ABDOMEN - 1 VIEW COMPARISON:  09/17/2018 FINDINGS: Patient's percutaneous gastrostomy tube is present over the lateral  aspect of the left mid to upper abdomen. Contrast is present within the tube. There is a moderate collection of contrast along the lateral margin of the left upper quadrant likely contrast extravasation indicating tube malposition. Multiple air-filled colonic loops with collection of contrast over the rectum. No dilated small bowel loops. No free peritoneal air. Remainder the exam is unchanged. IMPRESSION: Percutaneous gastrostomy tube over the left upper quadrant with injected contrast extravasated over the lateral margin of the left upper quadrant indicating tube malposition. Nonobstructive bowel gas pattern. Electronically Signed   By: Elberta Fortisaniel  Boyle M.D.   On: 09/19/2018 09:18   Dg Chest Port 1 View  Result Date: 09/19/2018 CLINICAL DATA:  Fever elevated heart rate EXAM: PORTABLE CHEST 1 VIEW COMPARISON:  09/17/2018, 08/30/2018, 08/18/2018 FINDINGS: Tracheostomy tube is in place. No acute airspace disease or effusion. Normal heart size. No pneumothorax. Air distension of the bowel in the upper abdomen. IMPRESSION: No active disease. Electronically Signed   By: Jasmine PangKim  Fujinaga M.D.   On: 09/19/2018 20:19    Assessment/Plan Active Problems:   Acute on chronic respiratory failure with hypoxia (HCC)   Aspiration pneumonia due to gastric secretions (HCC)   MMN (multifocal motor neuropathy) (HCC)   Severe sepsis (HCC)   Diastolic dysfunction with chronic heart failure (HCC)   1. Acute on chronic respiratory failure with hypoxia we will continue T collar during the daytime rest on the ventilator at nighttime secretion management pulmonary toilet. 2. Aspiration pneumonia treated no active disease noted on the chest film 3. Multifocal neuropathy supportive care prognosis guarded 4. Severe sepsis resolved 5. Diastolic dysfunction, compensated at baseline   I have personally seen and evaluated the patient, evaluated laboratory and imaging results, formulated the assessment and plan and placed  orders. The Patient requires high complexity decision making for assessment and support.  Case was discussed on Rounds with the Respiratory Therapy Staff  Yevonne PaxSaadat A Khan, MD [facility] Pulmonary Critical Care Medicine Sleep Medicine

## 2018-09-21 ENCOUNTER — Other Ambulatory Visit (HOSPITAL_COMMUNITY): Payer: Medicaid Other

## 2018-09-21 ENCOUNTER — Encounter (HOSPITAL_COMMUNITY): Payer: Self-pay | Admitting: Interventional Radiology

## 2018-09-21 DIAGNOSIS — I5032 Chronic diastolic (congestive) heart failure: Secondary | ICD-10-CM | POA: Diagnosis not present

## 2018-09-21 DIAGNOSIS — J9621 Acute and chronic respiratory failure with hypoxia: Secondary | ICD-10-CM | POA: Diagnosis not present

## 2018-09-21 DIAGNOSIS — J690 Pneumonitis due to inhalation of food and vomit: Secondary | ICD-10-CM | POA: Diagnosis not present

## 2018-09-21 DIAGNOSIS — A419 Sepsis, unspecified organism: Secondary | ICD-10-CM | POA: Diagnosis not present

## 2018-09-21 HISTORY — PX: IR REPLC GASTRO/COLONIC TUBE PERCUT W/FLUORO: IMG2333

## 2018-09-21 LAB — COMPREHENSIVE METABOLIC PANEL
ALT: 43 U/L (ref 0–44)
AST: 26 U/L (ref 15–41)
Albumin: 1.7 g/dL — ABNORMAL LOW (ref 3.5–5.0)
Alkaline Phosphatase: 91 U/L (ref 38–126)
Anion gap: 11 (ref 5–15)
BUN: 26 mg/dL — ABNORMAL HIGH (ref 6–20)
CO2: 16 mmol/L — ABNORMAL LOW (ref 22–32)
Calcium: 7.8 mg/dL — ABNORMAL LOW (ref 8.9–10.3)
Chloride: 109 mmol/L (ref 98–111)
Creatinine, Ser: 0.54 mg/dL (ref 0.44–1.00)
GFR calc Af Amer: >60 mL/min (ref >60)
GFR calc non Af Amer: >60 mL/min (ref >60)
Glucose, Bld: 100 mg/dL — ABNORMAL HIGH (ref 70–99)
Potassium: 2.8 mmol/L — ABNORMAL LOW (ref 3.5–5.1)
Sodium: 136 mmol/L (ref 135–145)
Total Bilirubin: 0.4 mg/dL (ref 0.3–1.2)
Total Protein: 4.9 g/dL — ABNORMAL LOW (ref 6.5–8.1)

## 2018-09-21 LAB — CBC
HCT: 35.1 % — ABNORMAL LOW (ref 36.0–46.0)
Hemoglobin: 11.3 g/dL — ABNORMAL LOW (ref 12.0–15.0)
MCH: 27.6 pg (ref 26.0–34.0)
MCHC: 32.2 g/dL (ref 30.0–36.0)
MCV: 85.8 fL (ref 80.0–100.0)
Platelets: 266 10*3/uL (ref 150–400)
RBC: 4.09 MIL/uL (ref 3.87–5.11)
RDW: 14.7 % (ref 11.5–15.5)
WBC: 12.8 10*3/uL — ABNORMAL HIGH (ref 4.0–10.5)
nRBC: 0 % (ref 0.0–0.2)

## 2018-09-21 LAB — TRIGLYCERIDES: Triglycerides: 77 mg/dL (ref <150)

## 2018-09-21 LAB — MAGNESIUM: Magnesium: 1.8 mg/dL (ref 1.7–2.4)

## 2018-09-21 MED ORDER — LIDOCAINE VISCOUS HCL 2 % MT SOLN
OROMUCOSAL | Status: DC | PRN
  Administered 2018-09-21: 15 mL via OROMUCOSAL

## 2018-09-21 MED ORDER — IOHEXOL 300 MG/ML  SOLN
50.0000 mL | Freq: Once | INTRAMUSCULAR | Status: AC | PRN
  Administered 2018-09-21: 16:00:00 20 mL via INTRAVENOUS

## 2018-09-21 MED ORDER — LIDOCAINE VISCOUS HCL 2 % MT SOLN
OROMUCOSAL | Status: AC
  Filled 2018-09-21: qty 15

## 2018-09-21 NOTE — Progress Notes (Signed)
[facility]   PULMONARY CRITICAL CARE SERVICE  PROGRESS NOTE  Date of Service: 09/21/2018  Tiffany Applebaumngela A Dettman  ZOX:[id number]  DOB: 1970-10-07   DOA: 08/04/2018  Referring Physician: Carron CurieAli Hijazi, MD  HPI: Tiffany Atkinson is a 48 y.o. female seen for follow up of Acute on Chronic Respiratory Failure.  Patient currently is on T collar has been on 28% FiO2  Medications: Reviewed on Rounds  Physical Exam:  Vitals: Temperature 98.3 pulse 113 respiratory 29 blood pressure 115/63 saturations 100%  Ventilator Settings off the ventilator right now on T collar  . General: Comfortable at this time . Eyes: Grossly normal lids, irises & conjunctiva . ENT: grossly tongue is normal . Neck: no obvious mass . Cardiovascular: S1 S2 normal no gallop . Respiratory: No rhonchi no rales are noted . Abdomen: soft . Skin: no rash seen on limited exam . Musculoskeletal: not rigid . Psychiatric:unable to assess . Neurologic: no seizure no involuntary movements         Lab Data:   Basic Metabolic Panel: Recent Labs  Lab 09/15/18 0732 09/16/18 1335 09/17/18 0511 09/17/18 1143 09/20/18 0744 09/20/18 1917  NA 140 138  --  138  --  136  K 3.4* 4.2  --  3.7  --  3.0*  CL 106 104  --  103  --  108  CO2 23 26  --  25  --  19*  GLUCOSE 92 110*  --  99  --  95  BUN 37* 31*  --  32*  --  25*  CREATININE 0.32* <0.30*  --  <0.30*  --  0.68  CALCIUM 8.9 8.6*  --  8.8*  --  8.1*  PHOS  --   --  3.8  --  2.9  --     ABG: No results for input(s): PHART, PCO2ART, PO2ART, HCO3, O2SAT in the last 168 hours.  Liver Function Tests: Recent Labs  Lab 09/20/18 1917  AST 50*  ALT 60*  ALKPHOS 100  BILITOT 0.5  PROT 5.2*  ALBUMIN 1.9*   Recent Labs  Lab 09/20/18 1917  LIPASE 19  AMYLASE 18*   No results for input(s): AMMONIA in the last 168 hours.  CBC: Recent Labs  Lab 09/17/18 1143 09/20/18 0744 09/21/18 0657  WBC 9.8 19.7* 12.8*   NEUTROABS  --  17.2*  --   HGB 9.6* 9.8* 11.3*  HCT 30.4* 29.5* 35.1*  MCV 88.6 84.8 85.8  PLT 402* 382 266    Cardiac Enzymes: No results for input(s): CKTOTAL, CKMB, CKMBINDEX, TROPONINI in the last 168 hours.  BNP (last 3 results) No results for input(s): BNP in the last 8760 hours.  ProBNP (last 3 results) No results for input(s): PROBNP in the last 8760 hours.  Radiological Exams: Ct Abdomen Pelvis Wo Contrast  Result Date: 09/21/2018 CLINICAL DATA:  Left upper quadrant pain EXAM: CT ABDOMEN AND PELVIS WITHOUT CONTRAST TECHNIQUE: Multidetector CT imaging of the abdomen and pelvis was performed following the standard protocol without IV contrast. COMPARISON:  09/08/2018 FINDINGS: Lower chest:  Airspace disease in both lower lobes Hepatobiliary: No focal liver abnormality.Cholecystectomy. Pancreas: Unremarkable. Spleen: Unremarkable. Adrenals/Urinary Tract: Negative adrenals. No hydronephrosis or stone. Contrast is seen within the bladder but not in the kidneys or upper collecting system. This is concerning for a bowel fistula, although a fistula is not clearly visualized. Stomach/Bowel: The patient's percutaneous gastrostomy tube has been retracted into the abdominal wall with stranding  and gas along the left flank. Stomach is still closely applied to the gastrostomy location. No pneumoperitoneum. The stomach is moderately distended by gas and fluid. Rectal tube in place. Generalized gaseous distension of bowel likely reflecting ileus. Vascular/Lymphatic: No acute vascular abnormality. No mass or adenopathy. Reproductive:Hysterectomy. Other: No ascites or pneumoperitoneum. Musculoskeletal: Skin thickening over the sacrum without erosion. These results will be called ASAP to the ordering clinician or representative by the Radiologist Assistant, and communication documented in the PACS or zVision Dashboard. IMPRESSION: 1. Interval malpositioning of percutaneous gastrostomy tube with gas and  fluid in the abdominal wall. The stomach is still closely applied to the gastrostomy-no pneumoperitoneum or peritoneal spillage. 2. Contrast seen in the bladder but not in the kidneys. This is concerning for a fistula, but none is clearly visualized. Please correlate with urinalysis/urinary output or any recent study using intravenous contrast. 3. Ileus pattern. 4. Lower lobe airspace disease compatible with pneumonia or aspiration. The stomach is moderately distended. Electronically Signed   By: Monte Fantasia M.D.   On: 09/21/2018 06:09   Dg Abd 1 View  Result Date: 09/20/2018 CLINICAL DATA:  Left upper quadrant abdominal pain nausea and vomiting EXAM: ABDOMEN - 1 VIEW COMPARISON:  09/19/2018 FINDINGS: Gaseous distention of large bowel and possibly some small bowel loops as well contrast medium projecting in the lower pelvis is probably in the urinary bladder. From previous, and small bowel loops potentially measure up to about 4.0 cm in diameter. Cholecystectomy clips noted. IMPRESSION: 1. Continued gaseous distention of large bowel and some central abdominal loops of small bowel, mildly increased from yesterday's exam. Electronically Signed   By: Van Clines M.D.   On: 09/20/2018 18:16   Dg Abd 1 View  Result Date: 09/19/2018 CLINICAL DATA:  Fever abdomen pain EXAM: ABDOMEN - 1 VIEW COMPARISON:  09/19/2018, 09/17/2018, 09/12/2018 FINDINGS: No significant interval change in fairly extensive diffuse gaseous dilatation of small and large bowel. Radiopaque contrast within the rectum. Left upper quadrant gastrostomy tube. IMPRESSION: No significant interval change in prominent diffuse gaseous dilatation of bowel. Electronically Signed   By: Donavan Foil M.D.   On: 09/19/2018 20:24   Dg Chest Port 1 View  Result Date: 09/19/2018 CLINICAL DATA:  Fever elevated heart rate EXAM: PORTABLE CHEST 1 VIEW COMPARISON:  09/17/2018, 08/30/2018, 08/18/2018 FINDINGS: Tracheostomy tube is in place. No acute  airspace disease or effusion. Normal heart size. No pneumothorax. Air distension of the bowel in the upper abdomen. IMPRESSION: No active disease. Electronically Signed   By: Donavan Foil M.D.   On: 09/19/2018 20:19    Assessment/Plan Active Problems:   Acute on chronic respiratory failure with hypoxia (HCC)   Aspiration pneumonia due to gastric secretions (HCC)   MMN (multifocal motor neuropathy) (HCC)   Severe sepsis (HCC)   Diastolic dysfunction with chronic heart failure ([place])   1. Acute on chronic respiratory failure with hypoxia we will continue with T collar trials titrate oxygen as tolerated.  Patient resting on the ventilator at night 2. Multifocal motor neuropathy grossly unchanged continue present management 3. Aspiration pneumonia treated last chest x-ray was clean 4. Severe sepsis resolved hemodynamically stable 5. Diastolic dysfunction diuretics as tolerated   I have personally seen and evaluated the patient, evaluated laboratory and imaging results, formulated the assessment and plan and placed orders. The Patient requires high complexity decision making for assessment and support.  Case was discussed on Rounds with the Respiratory Therapy Staff  Allyne Gee, MD [facility] Pulmonary Critical  Care Medicine Sleep Medicine

## 2018-09-21 NOTE — Procedures (Signed)
Pre procedural Dx: Dysphagia, Malpositioned G-tube. Post procedural Dx: Same  Successful fluoroscopic guided replacement/repositioning of 20 Fr balloon retention gastrostomy tube.   The feeding tube is ready for immediate use.  EBL: None Complications: None immediate.  Ronny Bacon, MD Pager #: [phone]

## 2018-09-22 ENCOUNTER — Other Ambulatory Visit (HOSPITAL_COMMUNITY): Payer: Medicaid Other

## 2018-09-22 DIAGNOSIS — J9621 Acute and chronic respiratory failure with hypoxia: Secondary | ICD-10-CM | POA: Diagnosis not present

## 2018-09-22 DIAGNOSIS — I5032 Chronic diastolic (congestive) heart failure: Secondary | ICD-10-CM | POA: Diagnosis not present

## 2018-09-22 DIAGNOSIS — J690 Pneumonitis due to inhalation of food and vomit: Secondary | ICD-10-CM | POA: Diagnosis not present

## 2018-09-22 DIAGNOSIS — A419 Sepsis, unspecified organism: Secondary | ICD-10-CM | POA: Diagnosis not present

## 2018-09-22 LAB — CULTURE, BLOOD (ROUTINE X 2)
Culture: NO GROWTH
Culture: NO GROWTH
Special Requests: ADEQUATE
Special Requests: ADEQUATE

## 2018-09-22 LAB — POTASSIUM: Potassium: 2.8 mmol/L — ABNORMAL LOW (ref 3.5–5.1)

## 2018-09-22 NOTE — Progress Notes (Addendum)
[facility]   PULMONARY CRITICAL CARE SERVICE  PROGRESS NOTE  Date of Service: 09/22/2018  Tiffany Applebaumngela A Cuthbert  [id number]  DOB: April 18, 1970   DOA: 08/04/2018  Referring Physician: Carron CurieAli Hijazi, MD  HPI: Tiffany Atkinson is a 48 y.o. female seen for follow up of Acute on Chronic Respiratory Failure.  Patient continues on aerosol trach collar 28% FiO2 satting well with no fever or distress noted.  Medications: Reviewed on Rounds  Physical Exam:  Vitals: Pulse 100 respirations 31 BP 130/71 O2 sat 100% temp 98.6  Ventilator Settings ATC 28%  . General: Comfortable at this time . Eyes: Grossly normal lids, irises & conjunctiva . ENT: grossly tongue is normal . Neck: no obvious mass . Cardiovascular: S1 S2 normal no gallop . Respiratory: No rales or rhonchi noted . Abdomen: soft . Skin: no rash seen on limited exam . Musculoskeletal: not rigid . Psychiatric:unable to assess . Neurologic: no seizure no involuntary movements         Lab Data:   Basic Metabolic Panel: Recent Labs  Lab 09/16/18 1335 09/17/18 0511 09/17/18 1143 09/20/18 0744 09/20/18 1917 09/21/18 1059 09/22/18 1218  NA 138  --  138  --  136 136  --   K 4.2  --  3.7  --  3.0* 2.8* 2.8*  CL 104  --  103  --  108 109  --   CO2 26  --  25  --  19* 16*  --   GLUCOSE 110*  --  99  --  95 100*  --   BUN 31*  --  32*  --  25* 26*  --   CREATININE <0.30*  --  <0.30*  --  0.68 0.54  --   CALCIUM 8.6*  --  8.8*  --  8.1* 7.8*  --   MG  --   --   --   --   --  1.8  --   PHOS  --  3.8  --  2.9  --   --   --     ABG: No results for input(s): PHART, PCO2ART, PO2ART, HCO3, O2SAT in the last 168 hours.  Liver Function Tests: Recent Labs  Lab 09/20/18 1917 09/21/18 1059  AST 50* 26  ALT 60* 43  ALKPHOS 100 91  BILITOT 0.5 0.4  PROT 5.2* 4.9*  ALBUMIN 1.9* 1.7*   Recent Labs  Lab 09/20/18 1917  LIPASE 19  AMYLASE 18*   No results for input(s): AMMONIA  in the last 168 hours.  CBC: Recent Labs  Lab 09/17/18 1143 09/20/18 0744 09/21/18 0657  WBC 9.8 19.7* 12.8*  NEUTROABS  --  17.2*  --   HGB 9.6* 9.8* 11.3*  HCT 30.4* 29.5* 35.1*  MCV 88.6 84.8 85.8  PLT 402* 382 266    Cardiac Enzymes: No results for input(s): CKTOTAL, CKMB, CKMBINDEX, TROPONINI in the last 168 hours.  BNP (last 3 results) No results for input(s): BNP in the last 8760 hours.  ProBNP (last 3 results) No results for input(s): PROBNP in the last 8760 hours.  Radiological Exams: Ct Abdomen Pelvis Wo Contrast  Result Date: 09/21/2018 CLINICAL DATA:  Left upper quadrant pain EXAM: CT ABDOMEN AND PELVIS WITHOUT CONTRAST TECHNIQUE: Multidetector CT imaging of the abdomen and pelvis was performed following the standard protocol without IV contrast. COMPARISON:  09/08/2018 FINDINGS: Lower chest:  Airspace disease in both lower lobes Hepatobiliary: No focal liver abnormality.Cholecystectomy. Pancreas: Unremarkable.  Spleen: Unremarkable. Adrenals/Urinary Tract: Negative adrenals. No hydronephrosis or stone. Contrast is seen within the bladder but not in the kidneys or upper collecting system. This is concerning for a bowel fistula, although a fistula is not clearly visualized. Stomach/Bowel: The patient's percutaneous gastrostomy tube has been retracted into the abdominal wall with stranding and gas along the left flank. Stomach is still closely applied to the gastrostomy location. No pneumoperitoneum. The stomach is moderately distended by gas and fluid. Rectal tube in place. Generalized gaseous distension of bowel likely reflecting ileus. Vascular/Lymphatic: No acute vascular abnormality. No mass or adenopathy. Reproductive:Hysterectomy. Other: No ascites or pneumoperitoneum. Musculoskeletal: Skin thickening over the sacrum without erosion. These results will be called ASAP to the ordering clinician or representative by the Radiologist Assistant, and communication documented in  the PACS or zVision Dashboard. IMPRESSION: 1. Interval malpositioning of percutaneous gastrostomy tube with gas and fluid in the abdominal wall. The stomach is still closely applied to the gastrostomy-no pneumoperitoneum or peritoneal spillage. 2. Contrast seen in the bladder but not in the kidneys. This is concerning for a fistula, but none is clearly visualized. Please correlate with urinalysis/urinary output or any recent study using intravenous contrast. 3. Ileus pattern. 4. Lower lobe airspace disease compatible with pneumonia or aspiration. The stomach is moderately distended. Electronically Signed   By: Monte Fantasia M.D.   On: 09/21/2018 06:09   Dg Abd 1 View  Result Date: 09/22/2018 CLINICAL DATA:  Ileus. EXAM: ABDOMEN - 1 VIEW COMPARISON:  Radiograph of September 20, 2018. FINDINGS: Interval placement of gastrostomy tube in the left upper quadrant in the expected position of the stomach. Subcutaneous emphysema is seen along the left lateral abdominal wall. Continued distention of large and small bowel loops is noted suggesting possible ileus. Status post cholecystectomy. IMPRESSION: Continued large and small bowel distention is noted suggesting possible ileus. Interval placement of gastrostomy tube in the left upper quadrant, with large amount of subcutaneous emphysema is seen over left lateral chest wall. Electronically Signed   By: Marijo Conception M.D.   On: 09/22/2018 13:23   Dg Abd 1 View  Result Date: 09/20/2018 CLINICAL DATA:  Left upper quadrant abdominal pain nausea and vomiting EXAM: ABDOMEN - 1 VIEW COMPARISON:  09/19/2018 FINDINGS: Gaseous distention of large bowel and possibly some small bowel loops as well contrast medium projecting in the lower pelvis is probably in the urinary bladder. From previous, and small bowel loops potentially measure up to about 4.0 cm in diameter. Cholecystectomy clips noted. IMPRESSION: 1. Continued gaseous distention of large bowel and some central abdominal  loops of small bowel, mildly increased from yesterday's exam. Electronically Signed   By: Van Clines M.D.   On: 09/20/2018 18:16   Ir Replc Gastro/colonic Tube Percut W/fluoro  Result Date: 09/21/2018 INDICATION: History of gastrostomy tube placement at an outside institution, now with concern for gastrostomy tube malpositioning. EXAM: FLUOROSCOPIC GUIDED REPLACEMENT OF GASTROSTOMY TUBE COMPARISON:  CT abdomen and pelvis - 09/21/2018; 09/08/2018 MEDICATIONS: None. CONTRAST:  27mL OMNIPAQUE IOHEXOL 300 MG/ML SOLN administered into the gastric lumen FLUOROSCOPY TIME:  1 minute, 12 seconds (10.6 mGy) COMPLICATIONS: None immediate. PROCEDURE: Patient was positioned supine on the fluoroscopy table. Preprocedural spot fluoroscopic image was obtained of the left upper abdominal quadrant and the existing percutaneous pull-through gastrostomy tube Contrast injection confirmed malpositioning of the post the gastrostomy tube with retention disc outside the confines of the gastric lumen. As such, a short Amplatz wire was utilized to manipulate the Kumpe catheter through  the gastrostomy tube track to the level of the gastric lumen. Contrast injection confirmed appropriate positioning. Next, the existing pull-through gastrostomy tube was removed. Under fluoroscopic guidance, the track was serially dilated ultimately allowing placement of a balloon retention 20 French gastrostomy tube. The gastrostomy balloon was insufflated with approximately 10 cc of saline and dilute contrast and pulled taut against the anterior wall the stomach lumen with the external disc cinched against the ventral wall of the abdomen. Small amount of contrast was injected via the gastrostomy tube demonstrating appropriate positioning and functionality functionality. The gastrostomy tube was flushed with a small amount of saline. A dressing was placed. The patient tolerated the procedure well without immediate postprocedural complication.  IMPRESSION: Successful fluoroscopic guided replacement/repositioning of 20 French balloon retention gastrostomy tube with end now terminating within the gastric lumen. The gastrostomy tube is ready for immediate use. Electronically Signed   By: Simonne ComeJohn  Watts M.D.   On: 09/21/2018 16:11    Assessment/Plan Active Problems:   Acute on chronic respiratory failure with hypoxia (HCC)   Aspiration pneumonia due to gastric secretions (HCC)   MMN (multifocal motor neuropathy) (HCC)   Severe sepsis (HCC)   Diastolic dysfunction with chronic heart failure (HCC)   1. Acute on chronic respiratory failure with hypoxia patient will continue with 28% trach collar also continue supportive measures and pulmonary toilet. 2. Multifocal motor neuropathy grossly unchanged continue present management 3. Aspiration pneumonia treated last chest x-ray was clean 4. Severe sepsis resolved hemodynamically stable 5. Diastolic dysfunction diuretics as tolerated   I have personally seen and evaluated the patient, evaluated laboratory and imaging results, formulated the assessment and plan and placed orders. The Patient requires high complexity decision making for assessment and support.  Case was discussed on Rounds with the Respiratory Therapy Staff  Yevonne PaxSaadat A Natale Barba, MD [facility] Pulmonary Critical Care Medicine Sleep Medicine

## 2018-09-23 DIAGNOSIS — J690 Pneumonitis due to inhalation of food and vomit: Secondary | ICD-10-CM | POA: Diagnosis not present

## 2018-09-23 DIAGNOSIS — I5032 Chronic diastolic (congestive) heart failure: Secondary | ICD-10-CM | POA: Diagnosis not present

## 2018-09-23 DIAGNOSIS — J9621 Acute and chronic respiratory failure with hypoxia: Secondary | ICD-10-CM | POA: Diagnosis not present

## 2018-09-23 DIAGNOSIS — A419 Sepsis, unspecified organism: Secondary | ICD-10-CM | POA: Diagnosis not present

## 2018-09-23 LAB — C DIFFICILE QUICK SCREEN W PCR REFLEX
C Diff antigen: NEGATIVE
C Diff interpretation: NOT DETECTED
C Diff toxin: NEGATIVE

## 2018-09-23 LAB — CBC
HCT: 26.1 % — ABNORMAL LOW (ref 36.0–46.0)
Hemoglobin: 8.4 g/dL — ABNORMAL LOW (ref 12.0–15.0)
MCH: 27.9 pg (ref 26.0–34.0)
MCHC: 32.2 g/dL (ref 30.0–36.0)
MCV: 86.7 fL (ref 80.0–100.0)
Platelets: 359 10*3/uL (ref 150–400)
RBC: 3.01 MIL/uL — ABNORMAL LOW (ref 3.87–5.11)
RDW: 14.4 % (ref 11.5–15.5)
WBC: 6.3 10*3/uL (ref 4.0–10.5)
nRBC: 0 % (ref 0.0–0.2)

## 2018-09-23 LAB — BASIC METABOLIC PANEL
Anion gap: 6 (ref 5–15)
BUN: 14 mg/dL (ref 6–20)
CO2: 23 mmol/L (ref 22–32)
Calcium: 7.9 mg/dL — ABNORMAL LOW (ref 8.9–10.3)
Chloride: 105 mmol/L (ref 98–111)
Creatinine, Ser: <0.3 mg/dL — ABNORMAL LOW (ref 0.44–1.00)
Glucose, Bld: 99 mg/dL (ref 70–99)
Potassium: 4.1 mmol/L (ref 3.5–5.1)
Sodium: 134 mmol/L — ABNORMAL LOW (ref 135–145)

## 2018-09-23 LAB — PHOSPHORUS: Phosphorus: 1.7 mg/dL — ABNORMAL LOW (ref 2.5–4.6)

## 2018-09-23 LAB — MAGNESIUM: Magnesium: 1.9 mg/dL (ref 1.7–2.4)

## 2018-09-23 NOTE — Progress Notes (Signed)
Pulmonary Critical Care Medicine [place]   PULMONARY CRITICAL CARE SERVICE  PROGRESS NOTE  Date of Service: 09/23/2018  KIMYA MCCAHILL  KGU:[id number]  DOB: 19-Jul-1970   DOA: 08/04/2018  Referring Physician: Merton Border, MD  HPI: Tiffany Atkinson is a 48 y.o. female seen for follow up of Acute on Chronic Respiratory Failure.  Doing well she is on T collar resting on the ventilator at nighttime awaiting discharge planning  Medications: Reviewed on Rounds  Physical Exam:  Vitals: Temperature 97.8 pulse 94 respiratory rate 15 blood pressure 100/65 saturations 100%  Ventilator Settings on T collar 28% FiO2  . General: Comfortable at this time . Eyes: Grossly normal lids, irises & conjunctiva . ENT: grossly tongue is normal . Neck: no obvious mass . Cardiovascular: S1 S2 normal no gallop . Respiratory: No rhonchi no rales are noted . Abdomen: soft . Skin: no rash seen on limited exam . Musculoskeletal: not rigid . Psychiatric:unable to assess . Neurologic: no seizure no involuntary movements         Lab Data:   Basic Metabolic Panel: Recent Labs  Lab 09/16/18 1335 09/17/18 0511 09/17/18 1143 09/20/18 0744 09/20/18 1917 09/21/18 1059 09/22/18 1218 09/23/18 0839  NA 138  --  138  --  136 136  --  134*  K 4.2  --  3.7  --  3.0* 2.8* 2.8* 4.1  CL 104  --  103  --  108 109  --  105  CO2 26  --  25  --  19* 16*  --  23  GLUCOSE 110*  --  99  --  95 100*  --  99  BUN 31*  --  32*  --  25* 26*  --  14  CREATININE <0.30*  --  <0.30*  --  0.68 0.54  --  <0.30*  CALCIUM 8.6*  --  8.8*  --  8.1* 7.8*  --  7.9*  MG  --   --   --   --   --  1.8  --  1.9  PHOS  --  3.8  --  2.9  --   --   --  1.7*    ABG: No results for input(s): PHART, PCO2ART, PO2ART, HCO3, O2SAT in the last 168 hours.  Liver Function Tests: Recent Labs  Lab 09/20/18 1917 09/21/18 1059  AST 50* 26  ALT 60* 43  ALKPHOS 100 91  BILITOT 0.5 0.4  PROT 5.2* 4.9*  ALBUMIN 1.9*  1.7*   Recent Labs  Lab 09/20/18 1917  LIPASE 19  AMYLASE 18*   No results for input(s): AMMONIA in the last 168 hours.  CBC: Recent Labs  Lab 09/17/18 1143 09/20/18 0744 09/21/18 0657 09/23/18 0839  WBC 9.8 19.7* 12.8* 6.3  NEUTROABS  --  17.2*  --   --   HGB 9.6* 9.8* 11.3* 8.4*  HCT 30.4* 29.5* 35.1* 26.1*  MCV 88.6 84.8 85.8 86.7  PLT 402* 382 266 359    Cardiac Enzymes: No results for input(s): CKTOTAL, CKMB, CKMBINDEX, TROPONINI in the last 168 hours.  BNP (last 3 results) No results for input(s): BNP in the last 8760 hours.  ProBNP (last 3 results) No results for input(s): PROBNP in the last 8760 hours.  Radiological Exams: Dg Abd 1 View  Result Date: 09/22/2018 CLINICAL DATA:  Ileus. EXAM: ABDOMEN - 1 VIEW COMPARISON:  Radiograph of September 20, 2018. FINDINGS: Interval placement of gastrostomy tube in the left upper  quadrant in the expected position of the stomach. Subcutaneous emphysema is seen along the left lateral abdominal wall. Continued distention of large and small bowel loops is noted suggesting possible ileus. Status post cholecystectomy. IMPRESSION: Continued large and small bowel distention is noted suggesting possible ileus. Interval placement of gastrostomy tube in the left upper quadrant, with large amount of subcutaneous emphysema is seen over left lateral chest wall. Electronically Signed   By: Lupita RaiderJames  Green Jr M.D.   On: 09/22/2018 13:23   Ir Replc Gastro/colonic Tube Percut W/fluoro  Result Date: 09/21/2018 INDICATION: History of gastrostomy tube placement at an outside institution, now with concern for gastrostomy tube malpositioning. EXAM: FLUOROSCOPIC GUIDED REPLACEMENT OF GASTROSTOMY TUBE COMPARISON:  CT abdomen and pelvis - 09/21/2018; 09/08/2018 MEDICATIONS: None. CONTRAST:  20mL OMNIPAQUE IOHEXOL 300 MG/ML SOLN administered into the gastric lumen FLUOROSCOPY TIME:  1 minute, 12 seconds (11.6 mGy) COMPLICATIONS: None immediate. PROCEDURE: Patient  was positioned supine on the fluoroscopy table. Preprocedural spot fluoroscopic image was obtained of the left upper abdominal quadrant and the existing percutaneous pull-through gastrostomy tube Contrast injection confirmed malpositioning of the post the gastrostomy tube with retention disc outside the confines of the gastric lumen. As such, a short Amplatz wire was utilized to manipulate the Kumpe catheter through the gastrostomy tube track to the level of the gastric lumen. Contrast injection confirmed appropriate positioning. Next, the existing pull-through gastrostomy tube was removed. Under fluoroscopic guidance, the track was serially dilated ultimately allowing placement of a balloon retention 20 French gastrostomy tube. The gastrostomy balloon was insufflated with approximately 10 cc of saline and dilute contrast and pulled taut against the anterior wall the stomach lumen with the external disc cinched against the ventral wall of the abdomen. Small amount of contrast was injected via the gastrostomy tube demonstrating appropriate positioning and functionality functionality. The gastrostomy tube was flushed with a small amount of saline. A dressing was placed. The patient tolerated the procedure well without immediate postprocedural complication. IMPRESSION: Successful fluoroscopic guided replacement/repositioning of 20 French balloon retention gastrostomy tube with end now terminating within the gastric lumen. The gastrostomy tube is ready for immediate use. Electronically Signed   By: Simonne ComeJohn  Watts M.D.   On: 09/21/2018 16:11    Assessment/Plan Active Problems:   Acute on chronic respiratory failure with hypoxia (HCC)   Aspiration pneumonia due to gastric secretions (HCC)   MMN (multifocal motor neuropathy) (HCC)   Severe sepsis (HCC)   Diastolic dysfunction with chronic heart failure (HCC)   1. Acute on chronic respiratory failure with hypoxia continue T collar trials titrate oxygen continue  pulmonary toilet 2. Aspiration pneumonia treated clinically improving 3. Multimodal focal motor neuropathy continue with supportive care 4. Severe sepsis resolved 5. Diastolic dysfunction compensated   I have personally seen and evaluated the patient, evaluated laboratory and imaging results, formulated the assessment and plan and placed orders. The Patient requires high complexity decision making for assessment and support.  Case was discussed on Rounds with the Respiratory Therapy Staff  Yevonne PaxSaadat A Khan, MD [facility] Pulmonary Critical Care Medicine Sleep Medicine

## 2018-09-24 DIAGNOSIS — A419 Sepsis, unspecified organism: Secondary | ICD-10-CM | POA: Diagnosis not present

## 2018-09-24 DIAGNOSIS — J9621 Acute and chronic respiratory failure with hypoxia: Secondary | ICD-10-CM | POA: Diagnosis not present

## 2018-09-24 DIAGNOSIS — I5032 Chronic diastolic (congestive) heart failure: Secondary | ICD-10-CM | POA: Diagnosis not present

## 2018-09-24 DIAGNOSIS — J690 Pneumonitis due to inhalation of food and vomit: Secondary | ICD-10-CM | POA: Diagnosis not present

## 2018-09-24 LAB — CULTURE, BLOOD (ROUTINE X 2)
Culture: NO GROWTH
Culture: NO GROWTH
Special Requests: ADEQUATE
Special Requests: ADEQUATE

## 2018-09-24 NOTE — Progress Notes (Signed)
[facility]   PULMONARY CRITICAL CARE SERVICE  PROGRESS NOTE  Date of Service: 09/24/2018  Tiffany Applebaumngela A Vitale  ZOX:[id number]  DOB: [birth year]   DOA: 08/04/2018  Referring Physician: Carron CurieAli Hijazi, MD  HPI: Tiffany Atkinson is a 48 y.o. female seen for follow up of Acute on Chronic Respiratory Failure.  Patient currently is comfortable on T collar has been on 28% FiO2 with the PMV  Medications: Reviewed on Rounds  Physical Exam:  Vitals: Temperature 97.4 pulse 110 respiratory rate 26 blood pressure 123/65 saturations 98%  Ventilator Settings on T collar currently on 28% FiO2  . General: Comfortable at this time . Eyes: Grossly normal lids, irises & conjunctiva . ENT: grossly tongue is normal . Neck: no obvious mass . Cardiovascular: S1 S2 normal no gallop . Respiratory: No rhonchi no rales are noted . Abdomen: soft . Skin: no rash seen on limited exam . Musculoskeletal: not rigid . Psychiatric:unable to assess . Neurologic: no seizure no involuntary movements         Lab Data:   Basic Metabolic Panel: Recent Labs  Lab 09/20/18 0744 09/20/18 1917 09/21/18 1059 09/22/18 1218 09/23/18 0839  NA  --  136 136  --  134*  K  --  3.0* 2.8* 2.8* 4.1  CL  --  108 109  --  105  CO2  --  19* 16*  --  23  GLUCOSE  --  95 100*  --  99  BUN  --  25* 26*  --  14  CREATININE  --  0.68 0.54  --  <0.30*  CALCIUM  --  8.1* 7.8*  --  7.9*  MG  --   --  1.8  --  1.9  PHOS 2.9  --   --   --  1.7*    ABG: No results for input(s): PHART, PCO2ART, PO2ART, HCO3, O2SAT in the last 168 hours.  Liver Function Tests: Recent Labs  Lab 09/20/18 1917 09/21/18 1059  AST 50* 26  ALT 60* 43  ALKPHOS 100 91  BILITOT 0.5 0.4  PROT 5.2* 4.9*  ALBUMIN 1.9* 1.7*   Recent Labs  Lab 09/20/18 1917  LIPASE 19  AMYLASE 18*   No results for input(s): AMMONIA in the last 168 hours.  CBC: Recent Labs  Lab 09/20/18 0744 09/21/18 0657  09/23/18 0839  WBC 19.7* 12.8* 6.3  NEUTROABS 17.2*  --   --   HGB 9.8* 11.3* 8.4*  HCT 29.5* 35.1* 26.1*  MCV 84.8 85.8 86.7  PLT 382 266 359    Cardiac Enzymes: No results for input(s): CKTOTAL, CKMB, CKMBINDEX, TROPONINI in the last 168 hours.  BNP (last 3 results) No results for input(s): BNP in the last 8760 hours.  ProBNP (last 3 results) No results for input(s): PROBNP in the last 8760 hours.  Radiological Exams: Dg Abd 1 View  Result Date: 09/22/2018 CLINICAL DATA:  Ileus. EXAM: ABDOMEN - 1 VIEW COMPARISON:  Radiograph of September 20, 2018. FINDINGS: Interval placement of gastrostomy tube in the left upper quadrant in the expected position of the stomach. Subcutaneous emphysema is seen along the left lateral abdominal wall. Continued distention of large and small bowel loops is noted suggesting possible ileus. Status post cholecystectomy. IMPRESSION: Continued large and small bowel distention is noted suggesting possible ileus. Interval placement of gastrostomy tube in the left upper quadrant, with large amount of subcutaneous emphysema is seen over left lateral chest wall. Electronically Signed  By: Marijo Conception M.D.   On: 09/22/2018 13:23    Assessment/Plan Active Problems:   Acute on chronic respiratory failure with hypoxia (HCC)   Aspiration pneumonia due to gastric secretions (HCC)   MMN (multifocal motor neuropathy) (HCC)   Severe sepsis (HCC)   Diastolic dysfunction with chronic heart failure ([place])   1. Acute on chronic respiratory failure with hypoxia we will continue with T collar trials continue secretion management supportive care. 2. Aspiration pneumonia treated we will continue to follow along 3. Multifocal motor neuropathy at baseline 4. Severe sepsis resolved 5. Diastolic dysfunction compensated   I have personally seen and evaluated the patient, evaluated laboratory and imaging results, formulated the assessment and plan and placed orders. The Patient  requires high complexity decision making for assessment and support.  Case was discussed on Rounds with the Respiratory Therapy Staff  Allyne Gee, MD [facility] Pulmonary Critical Care Medicine Sleep Medicine

## 2018-09-25 DIAGNOSIS — I5032 Chronic diastolic (congestive) heart failure: Secondary | ICD-10-CM | POA: Diagnosis not present

## 2018-09-25 DIAGNOSIS — J9621 Acute and chronic respiratory failure with hypoxia: Secondary | ICD-10-CM | POA: Diagnosis not present

## 2018-09-25 DIAGNOSIS — J690 Pneumonitis due to inhalation of food and vomit: Secondary | ICD-10-CM | POA: Diagnosis not present

## 2018-09-25 DIAGNOSIS — A419 Sepsis, unspecified organism: Secondary | ICD-10-CM | POA: Diagnosis not present

## 2018-09-25 NOTE — Progress Notes (Addendum)
Pulmonary Critical Care Medicine [place]   PULMONARY CRITICAL CARE SERVICE  PROGRESS NOTE  Date of Service: 09/25/2018  MARKEDA NARVAEZ  PXT:[id number]  DOB: Nov 22, 1970   DOA: 08/04/2018  Referring Physician: Merton Border, MD  HPI: Tiffany Atkinson is a 48 y.o. female seen for follow up of Acute on Chronic Respiratory Failure.  Patient remains on 28% aerosol trach collar using PMV difficult.  Continues to rest the ventilator at night.  Medications: Reviewed on Rounds  Physical Exam:  Vitals: Pulse 100 respiration 28 BP 115/52 O2 sat 100% temp 97.3  Ventilator Settings ATC 28%  . General: Comfortable at this time . Eyes: Grossly normal lids, irises & conjunctiva . ENT: grossly tongue is normal . Neck: no obvious mass . Cardiovascular: S1 S2 normal no gallop . Respiratory: No rales or rhonchi noted . Abdomen: soft . Skin: no rash seen on limited exam . Musculoskeletal: not rigid . Psychiatric:unable to assess . Neurologic: no seizure no involuntary movements         Lab Data:   Basic Metabolic Panel: Recent Labs  Lab 09/20/18 0744 09/20/18 1917 09/21/18 1059 09/22/18 1218 09/23/18 0839  NA  --  136 136  --  134*  K  --  3.0* 2.8* 2.8* 4.1  CL  --  108 109  --  105  CO2  --  19* 16*  --  23  GLUCOSE  --  95 100*  --  99  BUN  --  25* 26*  --  14  CREATININE  --  0.68 0.54  --  <0.30*  CALCIUM  --  8.1* 7.8*  --  7.9*  MG  --   --  1.8  --  1.9  PHOS 2.9  --   --   --  1.7*    ABG: No results for input(s): PHART, PCO2ART, PO2ART, HCO3, O2SAT in the last 168 hours.  Liver Function Tests: Recent Labs  Lab 09/20/18 1917 09/21/18 1059  AST 50* 26  ALT 60* 43  ALKPHOS 100 91  BILITOT 0.5 0.4  PROT 5.2* 4.9*  ALBUMIN 1.9* 1.7*   Recent Labs  Lab 09/20/18 1917  LIPASE 19  AMYLASE 18*   No results for input(s): AMMONIA in the last 168 hours.  CBC: Recent Labs  Lab 09/20/18 0744 09/21/18 0657 09/23/18 0839  WBC 19.7* 12.8*  6.3  NEUTROABS 17.2*  --   --   HGB 9.8* 11.3* 8.4*  HCT 29.5* 35.1* 26.1*  MCV 84.8 85.8 86.7  PLT 382 266 359    Cardiac Enzymes: No results for input(s): CKTOTAL, CKMB, CKMBINDEX, TROPONINI in the last 168 hours.  BNP (last 3 results) No results for input(s): BNP in the last 8760 hours.  ProBNP (last 3 results) No results for input(s): PROBNP in the last 8760 hours.  Radiological Exams: No results found.  Assessment/Plan Active Problems:   Acute on chronic respiratory failure with hypoxia (HCC)   Aspiration pneumonia due to gastric secretions (HCC)   MMN (multifocal motor neuropathy) (HCC)   Severe sepsis (HCC)   Diastolic dysfunction with chronic heart failure ([place])   1. Acute on chronic respiratory failure with hypoxia we will continue with T collar trials and resting on the ventilator at night.  Continue supportive measures and pulmonary toilet. 2. Aspiration pneumonia treated we will continue to follow along 3. Multifocal motor neuropathy at baseline 4. Severe sepsis resolved 5. Diastolic dysfunction compensated   I have personally seen and  evaluated the patient, evaluated laboratory and imaging results, formulated the assessment and plan and placed orders. The Patient requires high complexity decision making for assessment and support.  Case was discussed on Rounds with the Respiratory Therapy Staff  Allyne Gee, MD [facility] Pulmonary Critical Care Medicine Sleep Medicine

## 2018-09-26 DIAGNOSIS — J9621 Acute and chronic respiratory failure with hypoxia: Secondary | ICD-10-CM | POA: Diagnosis not present

## 2018-09-26 DIAGNOSIS — A419 Sepsis, unspecified organism: Secondary | ICD-10-CM | POA: Diagnosis not present

## 2018-09-26 DIAGNOSIS — I5032 Chronic diastolic (congestive) heart failure: Secondary | ICD-10-CM | POA: Diagnosis not present

## 2018-09-26 DIAGNOSIS — J690 Pneumonitis due to inhalation of food and vomit: Secondary | ICD-10-CM | POA: Diagnosis not present

## 2018-09-26 LAB — MAGNESIUM: Magnesium: 1.9 mg/dL (ref 1.7–2.4)

## 2018-09-26 LAB — PHOSPHORUS: Phosphorus: 2.5 mg/dL (ref 2.5–4.6)

## 2018-09-26 NOTE — Progress Notes (Addendum)
Pulmonary Critical Care Medicine [place]   PULMONARY CRITICAL CARE SERVICE  PROGRESS NOTE  Date of Service: 09/26/2018  Tiffany Atkinson  [id number]  DOB: 02/11/1971   DOA: 08/04/2018  Referring Physician: Merton Border, MD  HPI: Tiffany Atkinson is a 48 y.o. female seen for follow up of Acute on Chronic Respiratory Failure.  Patient remains on aerosol trach collar 28% FiO2 using PMV without difficulty.  At night she rest on the ventilator with no distress.  Medications: Reviewed on Rounds  Physical Exam:  Vitals: Pulse 116 respirations 20 BP 109/72 O2 sat 100% temp 98.0  Ventilator Settings ATC 28%  . General: Comfortable at this time . Eyes: Grossly normal lids, irises & conjunctiva . ENT: grossly tongue is normal . Neck: no obvious mass . Cardiovascular: S1 S2 normal no gallop . Respiratory: No rales or rhonchi noted . Abdomen: soft . Skin: no rash seen on limited exam . Musculoskeletal: not rigid . Psychiatric:unable to assess . Neurologic: no seizure no involuntary movements         Lab Data:   Basic Metabolic Panel: Recent Labs  Lab 09/20/18 0744 09/20/18 1917 09/21/18 1059 09/22/18 1218 09/23/18 0839 09/26/18 1101  NA  --  136 136  --  134*  --   K  --  3.0* 2.8* 2.8* 4.1  --   CL  --  108 109  --  105  --   CO2  --  19* 16*  --  23  --   GLUCOSE  --  95 100*  --  99  --   BUN  --  25* 26*  --  14  --   CREATININE  --  0.68 0.54  --  <0.30*  --   CALCIUM  --  8.1* 7.8*  --  7.9*  --   MG  --   --  1.8  --  1.9 1.9  PHOS 2.9  --   --   --  1.7* 2.5    ABG: No results for input(s): PHART, PCO2ART, PO2ART, HCO3, O2SAT in the last 168 hours.  Liver Function Tests: Recent Labs  Lab 09/20/18 1917 09/21/18 1059  AST 50* 26  ALT 60* 43  ALKPHOS 100 91  BILITOT 0.5 0.4  PROT 5.2* 4.9*  ALBUMIN 1.9* 1.7*   Recent Labs  Lab 09/20/18 1917  LIPASE 19  AMYLASE 18*   No results for input(s): AMMONIA in the last 168  hours.  CBC: Recent Labs  Lab 09/20/18 0744 09/21/18 0657 09/23/18 0839  WBC 19.7* 12.8* 6.3  NEUTROABS 17.2*  --   --   HGB 9.8* 11.3* 8.4*  HCT 29.5* 35.1* 26.1*  MCV 84.8 85.8 86.7  PLT 382 266 359    Cardiac Enzymes: No results for input(s): CKTOTAL, CKMB, CKMBINDEX, TROPONINI in the last 168 hours.  BNP (last 3 results) No results for input(s): BNP in the last 8760 hours.  ProBNP (last 3 results) No results for input(s): PROBNP in the last 8760 hours.  Radiological Exams: No results found.  Assessment/Plan Active Problems:   Acute on chronic respiratory failure with hypoxia (HCC)   Aspiration pneumonia due to gastric secretions (HCC)   MMN (multifocal motor neuropathy) (HCC)   Severe sepsis (HCC)   Diastolic dysfunction with chronic heart failure ([place])   1. Acute on chronic respiratory failure with hypoxia we will continue with T collar trials and resting on the ventilator at night.  Continue supportive measures and  pulmonary toilet. 2. Aspiration pneumonia treated we will continue to follow along 3. Multifocal motor neuropathy at baseline 4. Severe sepsis resolved 5. Diastolic dysfunction compensated   I have personally seen and evaluated the patient, evaluated laboratory and imaging results, formulated the assessment and plan and placed orders. The Patient requires high complexity decision making for assessment and support.  Case was discussed on Rounds with the Respiratory Therapy Staff  Yevonne PaxSaadat A Khan, MD [facility] Pulmonary Critical Care Medicine Sleep Medicine

## 2018-09-27 DIAGNOSIS — J9621 Acute and chronic respiratory failure with hypoxia: Secondary | ICD-10-CM | POA: Diagnosis not present

## 2018-09-27 DIAGNOSIS — A419 Sepsis, unspecified organism: Secondary | ICD-10-CM | POA: Diagnosis not present

## 2018-09-27 DIAGNOSIS — I5032 Chronic diastolic (congestive) heart failure: Secondary | ICD-10-CM | POA: Diagnosis not present

## 2018-09-27 DIAGNOSIS — J690 Pneumonitis due to inhalation of food and vomit: Secondary | ICD-10-CM | POA: Diagnosis not present

## 2018-09-27 LAB — BASIC METABOLIC PANEL
Anion gap: 10 (ref 5–15)
BUN: 21 mg/dL — ABNORMAL HIGH (ref 6–20)
CO2: 21 mmol/L — ABNORMAL LOW (ref 22–32)
Calcium: 8.2 mg/dL — ABNORMAL LOW (ref 8.9–10.3)
Chloride: 104 mmol/L (ref 98–111)
Creatinine, Ser: 0.38 mg/dL — ABNORMAL LOW (ref 0.44–1.00)
GFR calc Af Amer: >60 mL/min (ref >60)
GFR calc non Af Amer: >60 mL/min (ref >60)
Glucose, Bld: 106 mg/dL — ABNORMAL HIGH (ref 70–99)
Potassium: 4 mmol/L (ref 3.5–5.1)
Sodium: 135 mmol/L (ref 135–145)

## 2018-09-27 NOTE — Progress Notes (Signed)
Pulmonary Critical Care Medicine [place]   PULMONARY CRITICAL CARE SERVICE  PROGRESS NOTE  Date of Service: 09/27/2018  JAHAIRA Atkinson  TWS:[id number]  DOB: 06-06-1970   DOA: 08/04/2018  Referring Physician: Merton Border, MD  HPI: Tiffany Atkinson is a 48 y.o. female seen for follow up of Acute on Chronic Respiratory Failure.  Patient right now is on T collar has been on 28% FiO2  Medications: Reviewed on Rounds  Physical Exam:  Vitals: Temperature 99.3 pulse 113 respiratory 26 blood pressure 138/69 saturations 100%  Ventilator Settings on T collar right now off the ventilator  . General: Comfortable at this time . Eyes: Grossly normal lids, irises & conjunctiva . ENT: grossly tongue is normal . Neck: no obvious mass . Cardiovascular: S1 S2 normal no gallop . Respiratory: No rhonchi no rales are noted . Abdomen: soft . Skin: no rash seen on limited exam . Musculoskeletal: not rigid . Psychiatric:unable to assess . Neurologic: no seizure no involuntary movements         Lab Data:   Basic Metabolic Panel: Recent Labs  Lab 09/20/18 1917 09/21/18 1059 09/22/18 1218 09/23/18 0839 09/26/18 1101 09/27/18 0820  NA 136 136  --  134*  --  135  K 3.0* 2.8* 2.8* 4.1  --  4.0  CL 108 109  --  105  --  104  CO2 19* 16*  --  23  --  21*  GLUCOSE 95 100*  --  99  --  106*  BUN 25* 26*  --  14  --  21*  CREATININE 0.68 0.54  --  <0.30*  --  0.38*  CALCIUM 8.1* 7.8*  --  7.9*  --  8.2*  MG  --  1.8  --  1.9 1.9  --   PHOS  --   --   --  1.7* 2.5  --     ABG: No results for input(s): PHART, PCO2ART, PO2ART, HCO3, O2SAT in the last 168 hours.  Liver Function Tests: Recent Labs  Lab 09/20/18 1917 09/21/18 1059  AST 50* 26  ALT 60* 43  ALKPHOS 100 91  BILITOT 0.5 0.4  PROT 5.2* 4.9*  ALBUMIN 1.9* 1.7*   Recent Labs  Lab 09/20/18 1917  LIPASE 19  AMYLASE 18*   No results for input(s): AMMONIA in the last 168 hours.  CBC: Recent Labs   Lab 09/21/18 0657 09/23/18 0839  WBC 12.8* 6.3  HGB 11.3* 8.4*  HCT 35.1* 26.1*  MCV 85.8 86.7  PLT 266 359    Cardiac Enzymes: No results for input(s): CKTOTAL, CKMB, CKMBINDEX, TROPONINI in the last 168 hours.  BNP (last 3 results) No results for input(s): BNP in the last 8760 hours.  ProBNP (last 3 results) No results for input(s): PROBNP in the last 8760 hours.  Radiological Exams: No results found.  Assessment/Plan Active Problems:   Acute on chronic respiratory failure with hypoxia (HCC)   Aspiration pneumonia due to gastric secretions (HCC)   MMN (multifocal motor neuropathy) (HCC)   Severe sepsis (HCC)   Diastolic dysfunction with chronic heart failure ([place])   1. Acute on chronic respiratory failure with hypoxia we will continue with T-piece as tolerated continue pulmonary toilet supportive care. 2. Aspiration pneumonia we will continue present management 3. Multifocal motor neuropathy unchanged 4. Severe sepsis resolved hemodynamically stable 5. Diastolic dysfunction we will continue with present management   I have personally seen and evaluated the patient, evaluated laboratory and  imaging results, formulated the assessment and plan and placed orders. The Patient requires high complexity decision making for assessment and support.  Case was discussed on Rounds with the Respiratory Therapy Staff  Allyne Gee, MD [facility] Pulmonary Critical Care Medicine Sleep Medicine

## 2018-09-28 DIAGNOSIS — J9621 Acute and chronic respiratory failure with hypoxia: Secondary | ICD-10-CM | POA: Diagnosis not present

## 2018-09-28 DIAGNOSIS — J690 Pneumonitis due to inhalation of food and vomit: Secondary | ICD-10-CM | POA: Diagnosis not present

## 2018-09-28 DIAGNOSIS — A419 Sepsis, unspecified organism: Secondary | ICD-10-CM | POA: Diagnosis not present

## 2018-09-28 DIAGNOSIS — I5032 Chronic diastolic (congestive) heart failure: Secondary | ICD-10-CM | POA: Diagnosis not present

## 2018-09-28 NOTE — Progress Notes (Addendum)
Pulmonary Critical Care Medicine [place]   PULMONARY CRITICAL CARE SERVICE  PROGRESS NOTE  Date of Service: 09/28/2018  Tiffany Atkinson  EHU:[id number]  DOB: 1971-01-19   DOA: 08/04/2018  Referring Physician: Merton Border, MD  HPI: Tiffany Atkinson is a 48 y.o. female seen for follow up of Acute on Chronic Respiratory Failure.  Patient was on aerosol trach collar 28% FiO2 satting well with no fever or distress noted.  Medications: Reviewed on Rounds  Physical Exam:  Vitals: Pulse 1 5 respirations 16 BP 112/60 O2 sat 100% temp 97.0  Ventilator Settings ATC 28%  . General: Comfortable at this time . Eyes: Grossly normal lids, irises & conjunctiva . ENT: grossly tongue is normal . Neck: no obvious mass . Cardiovascular: S1 S2 normal no gallop . Respiratory: No rales or rhonchi noted . Abdomen: soft . Skin: no rash seen on limited exam . Musculoskeletal: not rigid . Psychiatric:unable to assess . Neurologic: no seizure no involuntary movements         Lab Data:   Basic Metabolic Panel: Recent Labs  Lab 09/22/18 1218 09/23/18 0839 09/26/18 1101 09/27/18 0820  NA  --  134*  --  135  K 2.8* 4.1  --  4.0  CL  --  105  --  104  CO2  --  23  --  21*  GLUCOSE  --  99  --  106*  BUN  --  14  --  21*  CREATININE  --  <0.30*  --  0.38*  CALCIUM  --  7.9*  --  8.2*  MG  --  1.9 1.9  --   PHOS  --  1.7* 2.5  --     ABG: No results for input(s): PHART, PCO2ART, PO2ART, HCO3, O2SAT in the last 168 hours.  Liver Function Tests: No results for input(s): AST, ALT, ALKPHOS, BILITOT, PROT, ALBUMIN in the last 168 hours. No results for input(s): LIPASE, AMYLASE in the last 168 hours. No results for input(s): AMMONIA in the last 168 hours.  CBC: Recent Labs  Lab 09/23/18 0839  WBC 6.3  HGB 8.4*  HCT 26.1*  MCV 86.7  PLT 359    Cardiac Enzymes: No results for input(s): CKTOTAL, CKMB, CKMBINDEX, TROPONINI in the last 168 hours.  BNP (last 3  results) No results for input(s): BNP in the last 8760 hours.  ProBNP (last 3 results) No results for input(s): PROBNP in the last 8760 hours.  Radiological Exams: No results found.  Assessment/Plan Active Problems:   Acute on chronic respiratory failure with hypoxia (HCC)   Aspiration pneumonia due to gastric secretions (HCC)   MMN (multifocal motor neuropathy) (HCC)   Severe sepsis (HCC)   Diastolic dysfunction with chronic heart failure ([place])   1. Acute on chronic respiratory failure with hypoxia we will continue with T-piece as tolerated continue pulmonary toilet supportive care. 2. Aspiration pneumonia we will continue present management 3. Multifocal motor neuropathy unchanged 4. Severe sepsis resolved hemodynamically stable 5. Diastolic dysfunction we will continue with present management   I have personally seen and evaluated the patient, evaluated laboratory and imaging results, formulated the assessment and plan and placed orders. The Patient requires high complexity decision making for assessment and support.  Case was discussed on Rounds with the Respiratory Therapy Staff  Allyne Gee, MD [facility] Pulmonary Critical Care Medicine Sleep Medicine

## 2018-09-29 DIAGNOSIS — J9621 Acute and chronic respiratory failure with hypoxia: Secondary | ICD-10-CM | POA: Diagnosis not present

## 2018-09-29 DIAGNOSIS — J690 Pneumonitis due to inhalation of food and vomit: Secondary | ICD-10-CM | POA: Diagnosis not present

## 2018-09-29 DIAGNOSIS — A419 Sepsis, unspecified organism: Secondary | ICD-10-CM | POA: Diagnosis not present

## 2018-09-29 DIAGNOSIS — I5032 Chronic diastolic (congestive) heart failure: Secondary | ICD-10-CM | POA: Diagnosis not present

## 2018-09-29 LAB — NOVEL CORONAVIRUS, NAA (HOSP ORDER, SEND-OUT TO REF LAB; TAT 18-24 HRS): SARS-CoV-2, NAA: NOT DETECTED

## 2018-09-29 LAB — PHOSPHORUS: Phosphorus: 5.3 mg/dL — ABNORMAL HIGH (ref 2.5–4.6)

## 2018-09-29 NOTE — Progress Notes (Addendum)
Pulmonary Critical Care Medicine [place]   PULMONARY CRITICAL CARE SERVICE  PROGRESS NOTE  Date of Service: 09/29/2018  Tiffany Atkinson  [id number]  DOB: 09/27/1970   DOA: 08/04/2018  Referring Physician: Merton Border, MD  HPI: Tiffany Atkinson is a 48 y.o. female seen for follow up of Acute on Chronic Respiratory Failure.  Patient continues on trach collar 28% FiO2 satting well with no fever or distress noted.  Medications: Reviewed on Rounds  Physical Exam:  Vitals: Pulse 104 respirations 19 BP 110/52 O2 sat 100% 98.1  Ventilator Settings ATC 28%  . General: Comfortable at this time . Eyes: Grossly normal lids, irises & conjunctiva . ENT: grossly tongue is normal . Neck: no obvious mass . Cardiovascular: S1 S2 normal no gallop . Respiratory: No rales or rhonchi noted . Abdomen: soft . Skin: no rash seen on limited exam . Musculoskeletal: not rigid . Psychiatric:unable to assess . Neurologic: no seizure no involuntary movements         Lab Data:   Basic Metabolic Panel: Recent Labs  Lab 09/23/18 0839 09/26/18 1101 09/27/18 0820 09/29/18 0716  NA 134*  --  135  --   K 4.1  --  4.0  --   CL 105  --  104  --   CO2 23  --  21*  --   GLUCOSE 99  --  106*  --   BUN 14  --  21*  --   CREATININE <0.30*  --  0.38*  --   CALCIUM 7.9*  --  8.2*  --   MG 1.9 1.9  --   --   PHOS 1.7* 2.5  --  5.3*    ABG: No results for input(s): PHART, PCO2ART, PO2ART, HCO3, O2SAT in the last 168 hours.  Liver Function Tests: No results for input(s): AST, ALT, ALKPHOS, BILITOT, PROT, ALBUMIN in the last 168 hours. No results for input(s): LIPASE, AMYLASE in the last 168 hours. No results for input(s): AMMONIA in the last 168 hours.  CBC: Recent Labs  Lab 09/23/18 0839  WBC 6.3  HGB 8.4*  HCT 26.1*  MCV 86.7  PLT 359    Cardiac Enzymes: No results for input(s): CKTOTAL, CKMB, CKMBINDEX, TROPONINI in the last 168 hours.  BNP (last 3 results) No  results for input(s): BNP in the last 8760 hours.  ProBNP (last 3 results) No results for input(s): PROBNP in the last 8760 hours.  Radiological Exams: No results found.  Assessment/Plan Active Problems:   Acute on chronic respiratory failure with hypoxia (HCC)   Aspiration pneumonia due to gastric secretions (HCC)   MMN (multifocal motor neuropathy) (HCC)   Severe sepsis (HCC)   Diastolic dysfunction with chronic heart failure ([place])   1. Acute on chronic respiratory failure with hypoxia we will continue with T-piece as tolerated continue pulmonary toilet supportive care. 2. Aspiration pneumonia we will continue present management 3. Multifocal motor neuropathy unchanged 4. Severe sepsis resolved hemodynamically stable 5. Diastolic dysfunction we will continue with present management   I have personally seen and evaluated the patient, evaluated laboratory and imaging results, formulated the assessment and plan and placed orders. The Patient requires high complexity decision making for assessment and support.  Case was discussed on Rounds with the Respiratory Therapy Staff  Allyne Gee, MD [facility] Pulmonary Critical Care Medicine Sleep Medicine

## 2018-11-30 DIAGNOSIS — Z930 Tracheostomy status: Secondary | ICD-10-CM

## 2018-11-30 DIAGNOSIS — R6520 Severe sepsis without septic shock: Secondary | ICD-10-CM

## 2018-11-30 DIAGNOSIS — J9621 Acute and chronic respiratory failure with hypoxia: Secondary | ICD-10-CM | POA: Diagnosis not present

## 2018-11-30 DIAGNOSIS — N179 Acute kidney failure, unspecified: Secondary | ICD-10-CM | POA: Diagnosis not present

## 2018-12-01 DIAGNOSIS — J9621 Acute and chronic respiratory failure with hypoxia: Secondary | ICD-10-CM

## 2018-12-01 DIAGNOSIS — N179 Acute kidney failure, unspecified: Secondary | ICD-10-CM

## 2018-12-01 DIAGNOSIS — R6520 Severe sepsis without septic shock: Secondary | ICD-10-CM

## 2018-12-01 DIAGNOSIS — Z930 Tracheostomy status: Secondary | ICD-10-CM

## 2018-12-02 DIAGNOSIS — R6520 Severe sepsis without septic shock: Secondary | ICD-10-CM

## 2018-12-02 DIAGNOSIS — N179 Acute kidney failure, unspecified: Secondary | ICD-10-CM

## 2018-12-02 DIAGNOSIS — Z930 Tracheostomy status: Secondary | ICD-10-CM | POA: Diagnosis not present

## 2018-12-02 DIAGNOSIS — J9621 Acute and chronic respiratory failure with hypoxia: Secondary | ICD-10-CM

## 2018-12-03 DIAGNOSIS — N179 Acute kidney failure, unspecified: Secondary | ICD-10-CM

## 2018-12-03 DIAGNOSIS — Z930 Tracheostomy status: Secondary | ICD-10-CM

## 2018-12-03 DIAGNOSIS — J9621 Acute and chronic respiratory failure with hypoxia: Secondary | ICD-10-CM

## 2018-12-03 DIAGNOSIS — R6520 Severe sepsis without septic shock: Secondary | ICD-10-CM

## 2018-12-04 DIAGNOSIS — N179 Acute kidney failure, unspecified: Secondary | ICD-10-CM | POA: Diagnosis not present

## 2018-12-04 DIAGNOSIS — J9621 Acute and chronic respiratory failure with hypoxia: Secondary | ICD-10-CM

## 2018-12-04 DIAGNOSIS — Z930 Tracheostomy status: Secondary | ICD-10-CM

## 2018-12-04 DIAGNOSIS — R6520 Severe sepsis without septic shock: Secondary | ICD-10-CM

## 2018-12-05 DIAGNOSIS — J9621 Acute and chronic respiratory failure with hypoxia: Secondary | ICD-10-CM

## 2018-12-05 DIAGNOSIS — N179 Acute kidney failure, unspecified: Secondary | ICD-10-CM

## 2018-12-05 DIAGNOSIS — Z930 Tracheostomy status: Secondary | ICD-10-CM

## 2018-12-05 DIAGNOSIS — R6520 Severe sepsis without septic shock: Secondary | ICD-10-CM | POA: Diagnosis not present

## 2018-12-13 DIAGNOSIS — N179 Acute kidney failure, unspecified: Secondary | ICD-10-CM

## 2018-12-13 DIAGNOSIS — Z930 Tracheostomy status: Secondary | ICD-10-CM | POA: Diagnosis not present

## 2018-12-13 DIAGNOSIS — R6520 Severe sepsis without septic shock: Secondary | ICD-10-CM | POA: Diagnosis not present

## 2018-12-13 DIAGNOSIS — J9621 Acute and chronic respiratory failure with hypoxia: Secondary | ICD-10-CM | POA: Diagnosis not present

## 2018-12-14 DIAGNOSIS — J9621 Acute and chronic respiratory failure with hypoxia: Secondary | ICD-10-CM

## 2018-12-14 DIAGNOSIS — N179 Acute kidney failure, unspecified: Secondary | ICD-10-CM | POA: Diagnosis not present

## 2018-12-14 DIAGNOSIS — Z930 Tracheostomy status: Secondary | ICD-10-CM | POA: Diagnosis not present

## 2018-12-14 DIAGNOSIS — R6520 Severe sepsis without septic shock: Secondary | ICD-10-CM | POA: Diagnosis not present

## 2018-12-15 DIAGNOSIS — R6520 Severe sepsis without septic shock: Secondary | ICD-10-CM

## 2018-12-15 DIAGNOSIS — N179 Acute kidney failure, unspecified: Secondary | ICD-10-CM

## 2018-12-15 DIAGNOSIS — J9621 Acute and chronic respiratory failure with hypoxia: Secondary | ICD-10-CM | POA: Diagnosis not present

## 2018-12-15 DIAGNOSIS — Z930 Tracheostomy status: Secondary | ICD-10-CM | POA: Diagnosis not present

## 2018-12-16 DIAGNOSIS — J9621 Acute and chronic respiratory failure with hypoxia: Secondary | ICD-10-CM

## 2018-12-16 DIAGNOSIS — N179 Acute kidney failure, unspecified: Secondary | ICD-10-CM

## 2018-12-16 DIAGNOSIS — R6520 Severe sepsis without septic shock: Secondary | ICD-10-CM

## 2018-12-16 DIAGNOSIS — Z930 Tracheostomy status: Secondary | ICD-10-CM

## 2018-12-17 DIAGNOSIS — Z930 Tracheostomy status: Secondary | ICD-10-CM

## 2018-12-17 DIAGNOSIS — R6520 Severe sepsis without septic shock: Secondary | ICD-10-CM | POA: Diagnosis not present

## 2018-12-17 DIAGNOSIS — J9621 Acute and chronic respiratory failure with hypoxia: Secondary | ICD-10-CM

## 2018-12-17 DIAGNOSIS — N179 Acute kidney failure, unspecified: Secondary | ICD-10-CM

## 2018-12-18 DIAGNOSIS — Z930 Tracheostomy status: Secondary | ICD-10-CM

## 2018-12-18 DIAGNOSIS — R6520 Severe sepsis without septic shock: Secondary | ICD-10-CM | POA: Diagnosis not present

## 2018-12-18 DIAGNOSIS — J9621 Acute and chronic respiratory failure with hypoxia: Secondary | ICD-10-CM

## 2018-12-18 DIAGNOSIS — N179 Acute kidney failure, unspecified: Secondary | ICD-10-CM

## 2018-12-19 DIAGNOSIS — R6520 Severe sepsis without septic shock: Secondary | ICD-10-CM

## 2018-12-19 DIAGNOSIS — J9621 Acute and chronic respiratory failure with hypoxia: Secondary | ICD-10-CM | POA: Diagnosis not present

## 2018-12-19 DIAGNOSIS — N179 Acute kidney failure, unspecified: Secondary | ICD-10-CM | POA: Diagnosis not present

## 2018-12-19 DIAGNOSIS — Z930 Tracheostomy status: Secondary | ICD-10-CM | POA: Diagnosis not present

## 2018-12-24 DIAGNOSIS — R000 Tachycardia, unspecified: Secondary | ICD-10-CM | POA: Diagnosis not present

## 2018-12-25 DIAGNOSIS — I499 Cardiac arrhythmia, unspecified: Secondary | ICD-10-CM | POA: Diagnosis not present

## 2018-12-30 DEATH — deceased

## 2020-04-20 IMAGING — DX PORTABLE ABDOMEN - 1 VIEW
1 series · 1 of 1 positions shown · non-contrast
Comparison: 08/13/2018

CLINICAL DATA: Follow-up ileus

EXAM:
PORTABLE ABDOMEN - 1 VIEW

[abdomen kub]
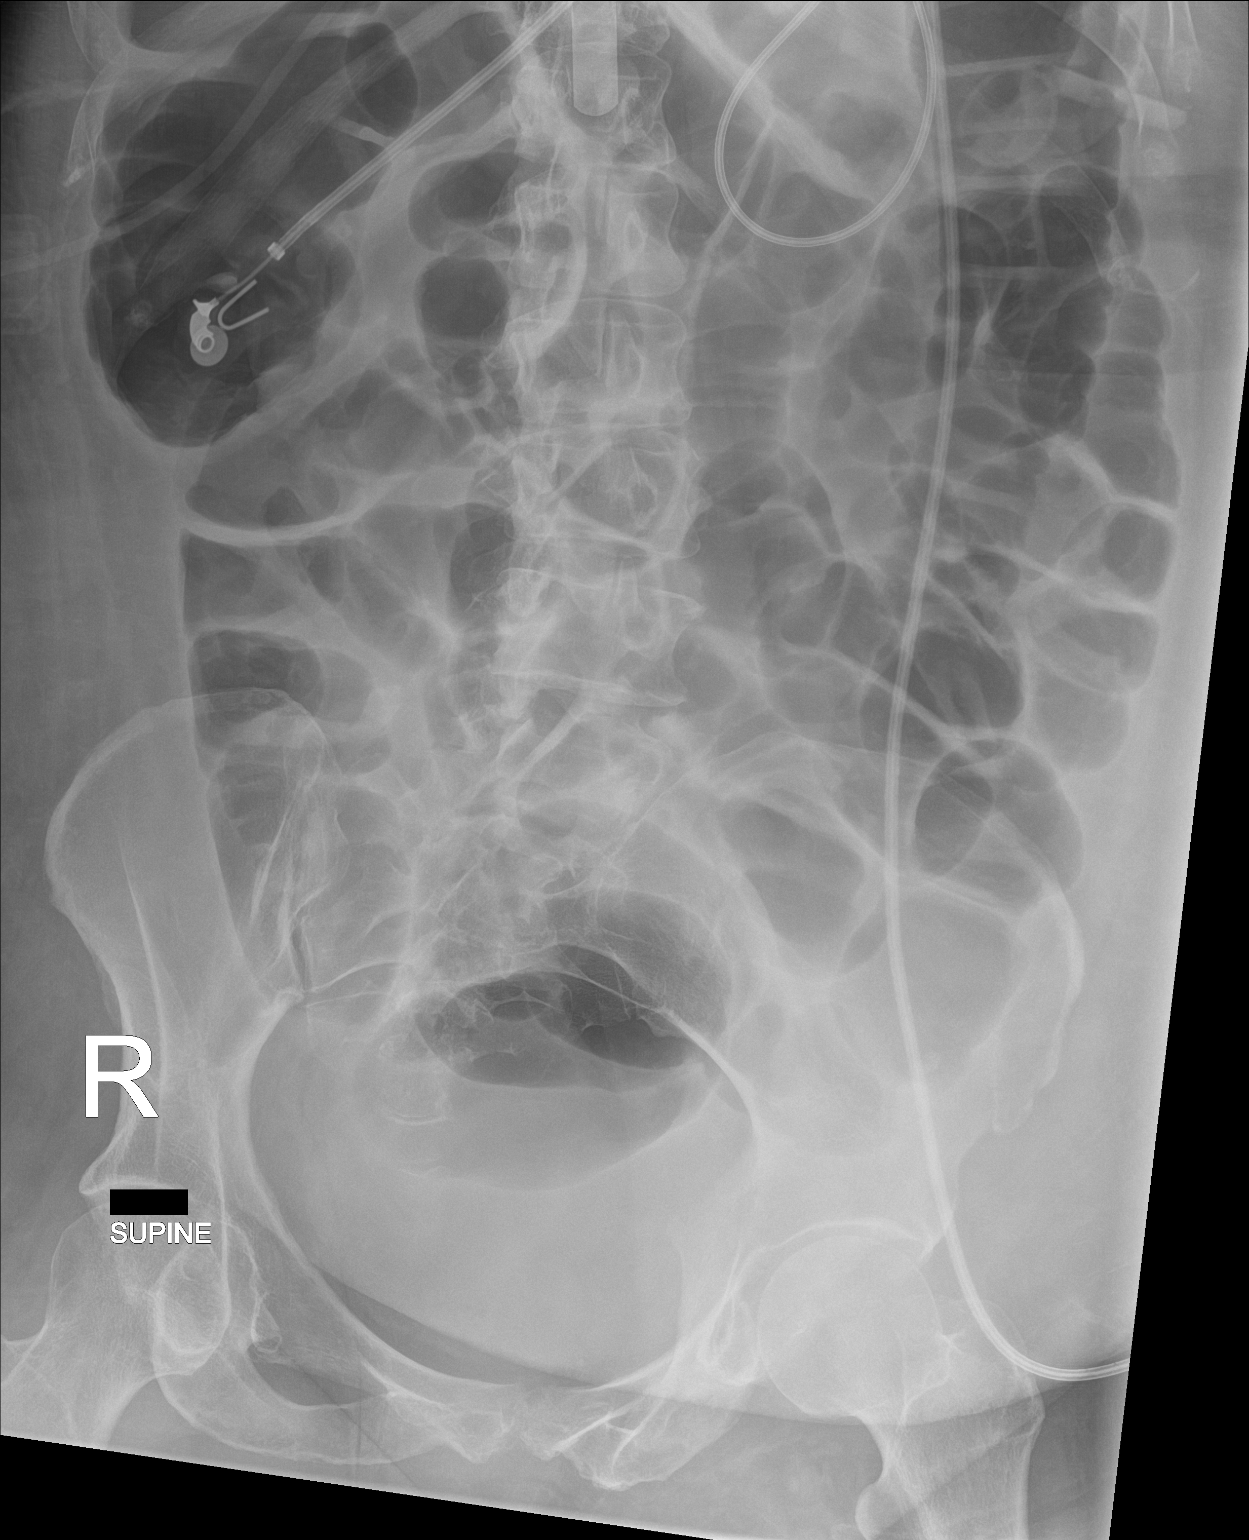

[1 of 1 positions shown; findings below may reference images not displayed]

FINDINGS: Scattered large and small bowel gas is noted. No true obstructive
changes are seen. The overall appearance is stable consistent with
underlying generalized ileus. Gastrostomy catheter is noted in the
left upper quadrant. No acute bony abnormality is seen.
IMPRESSION: Stable generalized ileus.

## 2020-04-20 IMAGING — DX PORTABLE CHEST - 1 VIEW
1 series · 1 of 1 positions shown · non-contrast
Comparison: 08/09/2018

CLINICAL DATA: Respiratory failure

EXAM:
PORTABLE CHEST 1 VIEW

[chest ap]
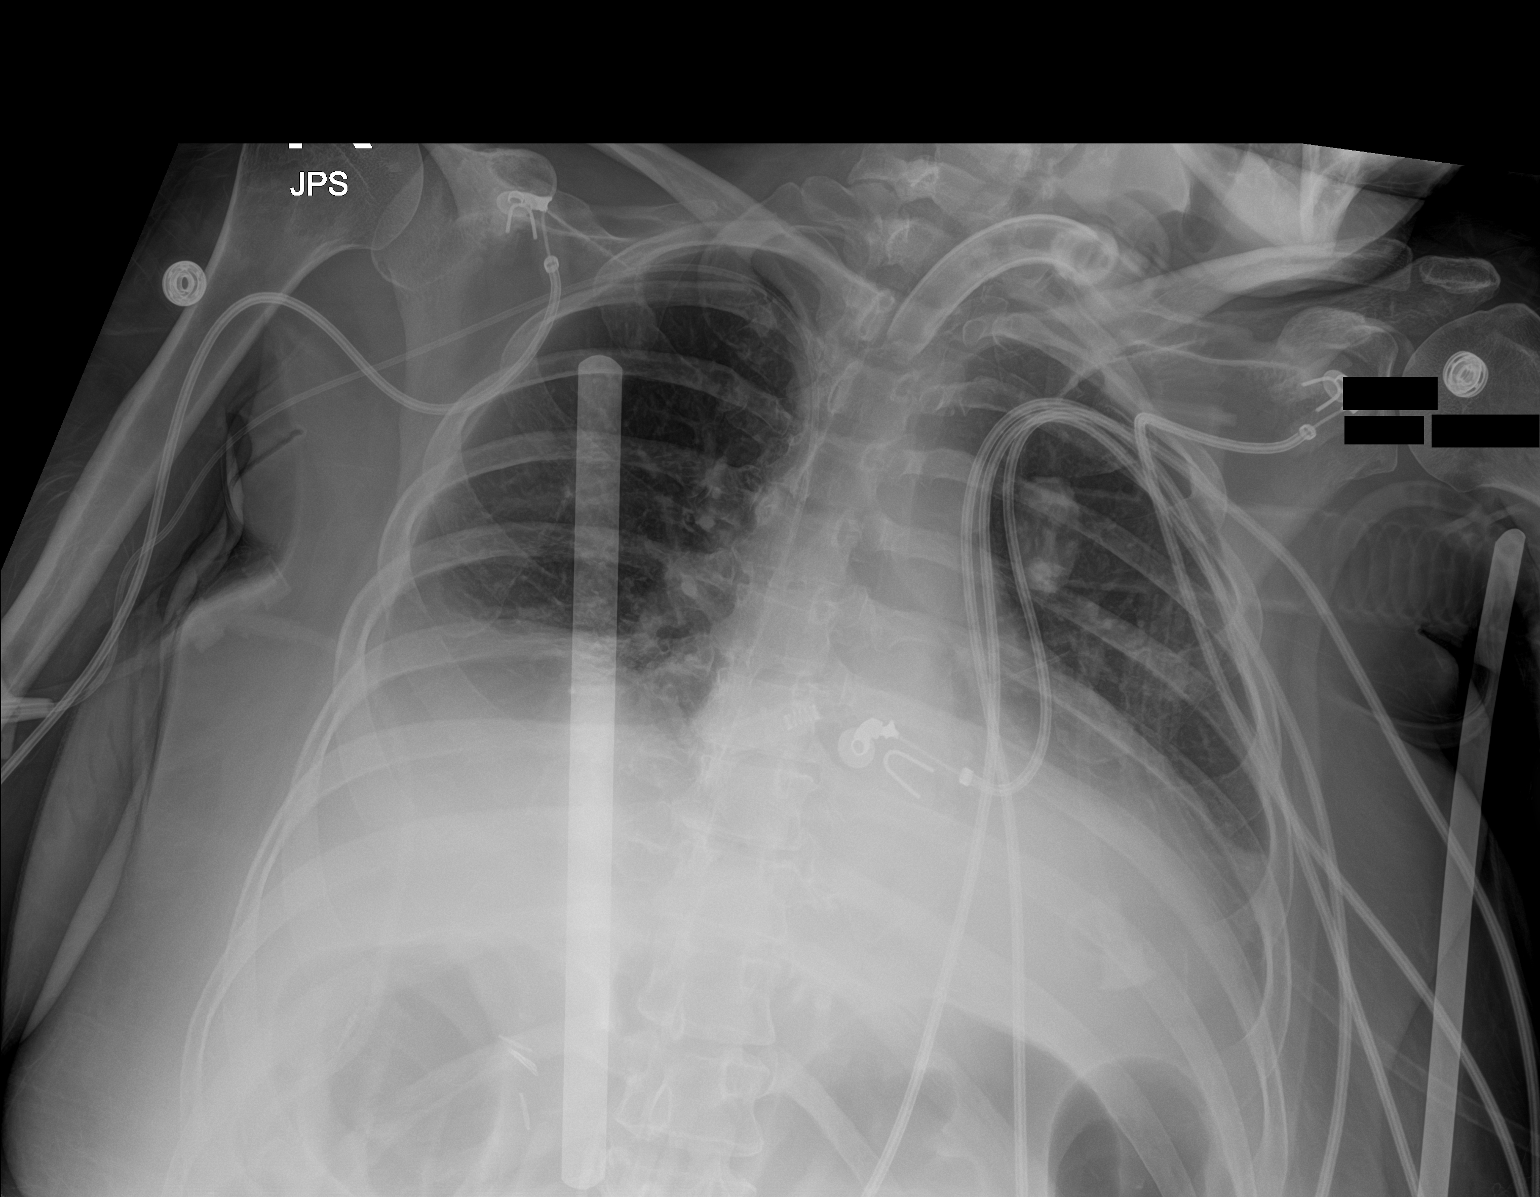

[1 of 1 positions shown; findings below may reference images not displayed]

FINDINGS: No tracheostomy tube and right-sided PICC line are again seen in
satisfactory position. Cardiac shadow is stable. Small pleural
effusions are noted left greater than right similar to that seen on
prior exam. No new focal infiltrate is seen. No bony abnormality is
noted.
IMPRESSION: No change and bilateral pleural effusions and underlying atelectatic
changes.

## 2020-05-16 IMAGING — DX ABDOMEN - 1 VIEW
2 series · 2 of 2 positions shown · non-contrast
Comparison: September 09, 2018

CLINICAL DATA: Follow-up ileus.

EXAM:
ABDOMEN - 1 VIEW

[abdomen kub (1 of 2)]
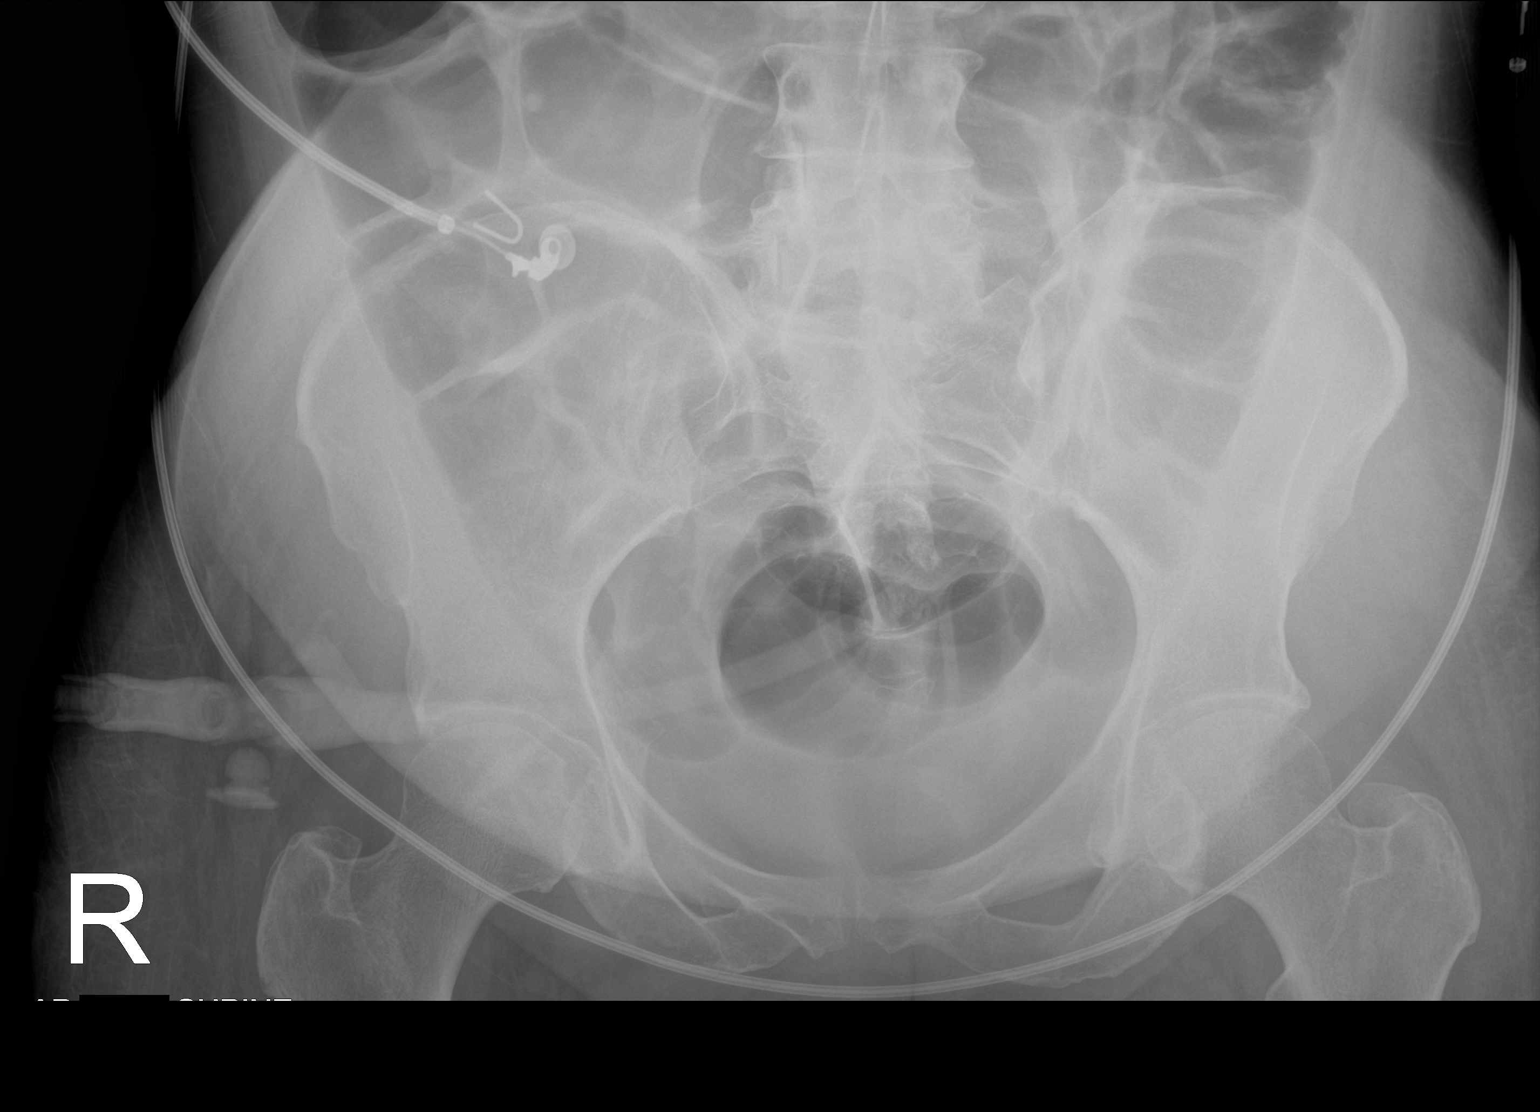

[abdomen kub (2 of 2)]
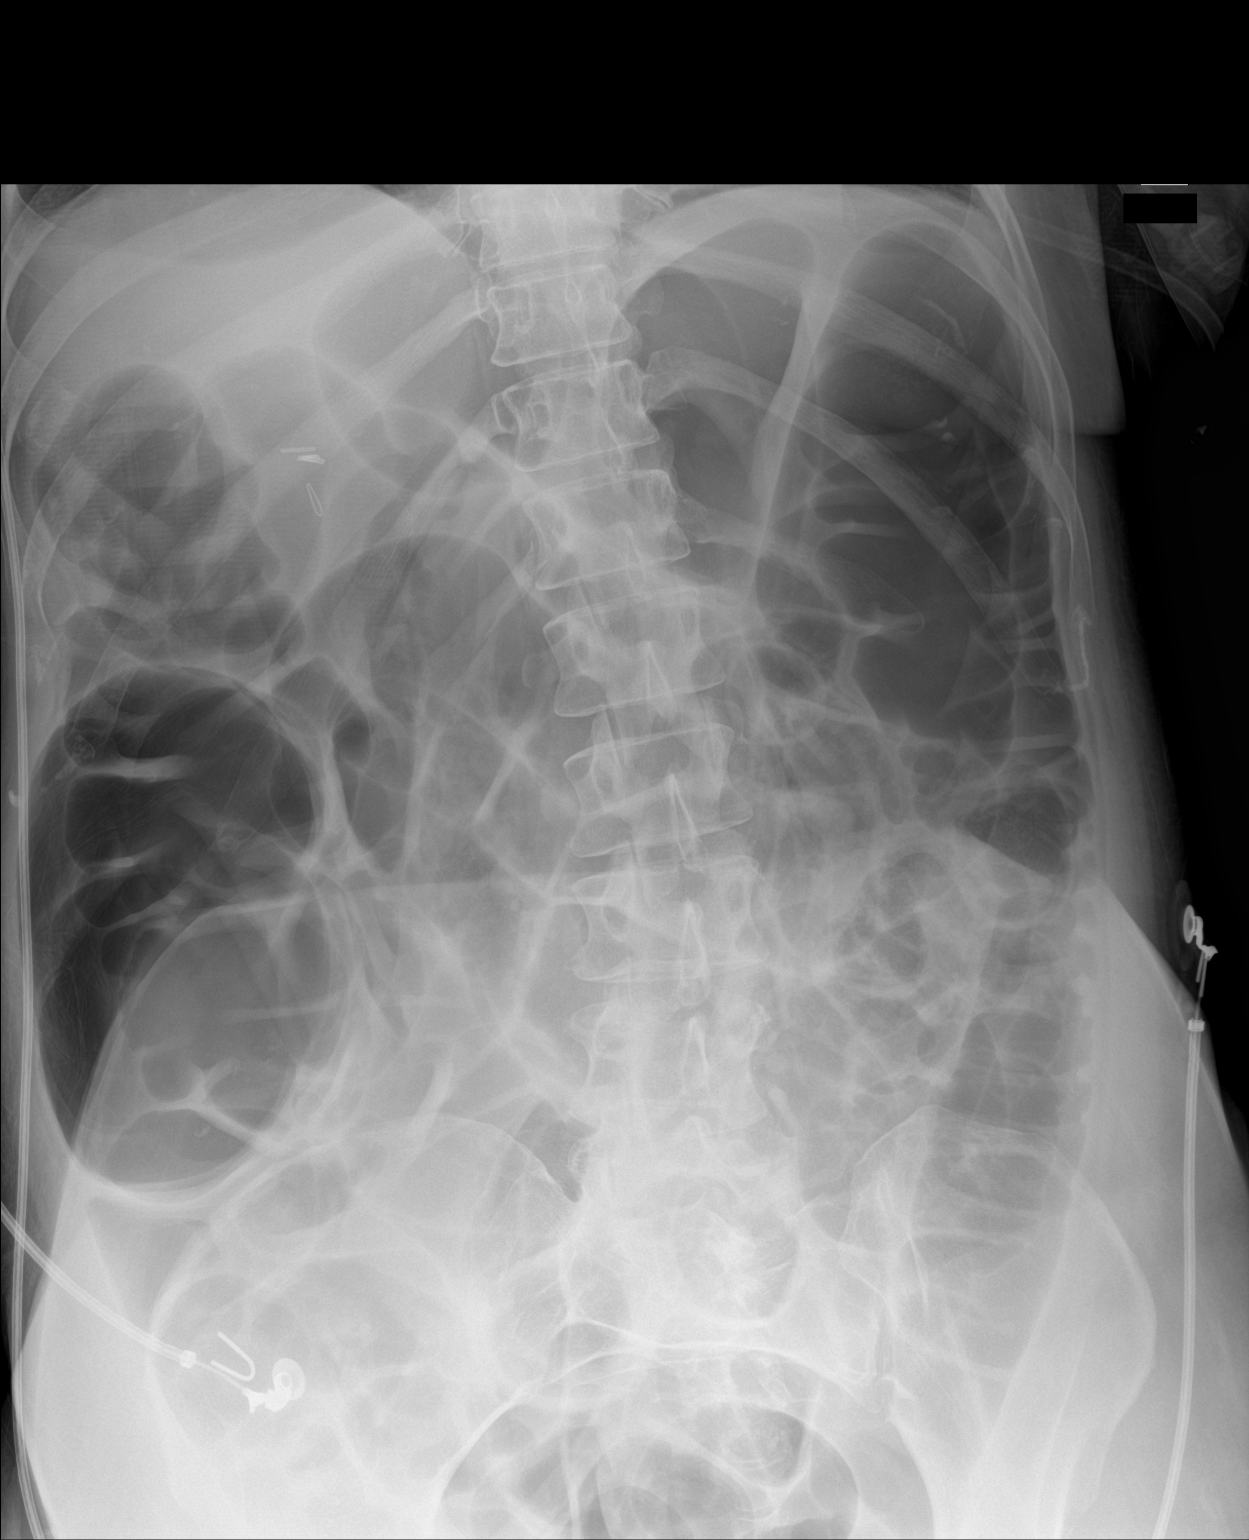

[2 of 2 positions shown; findings below may reference images not displayed]

FINDINGS: Air-filled prominent loops of large and small bowel are stable,
suggesting ileus. There is a small amount of gas in the rectum. No
free air, portal venous gas, or pneumatosis. No other abnormalities.
IMPRESSION: Prominent air-filled loops of large and small bowel consistent with
ileus, unchanged.

## 2020-05-23 IMAGING — DX PORTABLE CHEST - 1 VIEW
1 series · 1 of 1 positions shown · non-contrast
Comparison: 09/17/2018, 08/30/2018, 08/18/2018

CLINICAL DATA: Fever elevated heart rate

EXAM:
PORTABLE CHEST 1 VIEW

[chest ap]
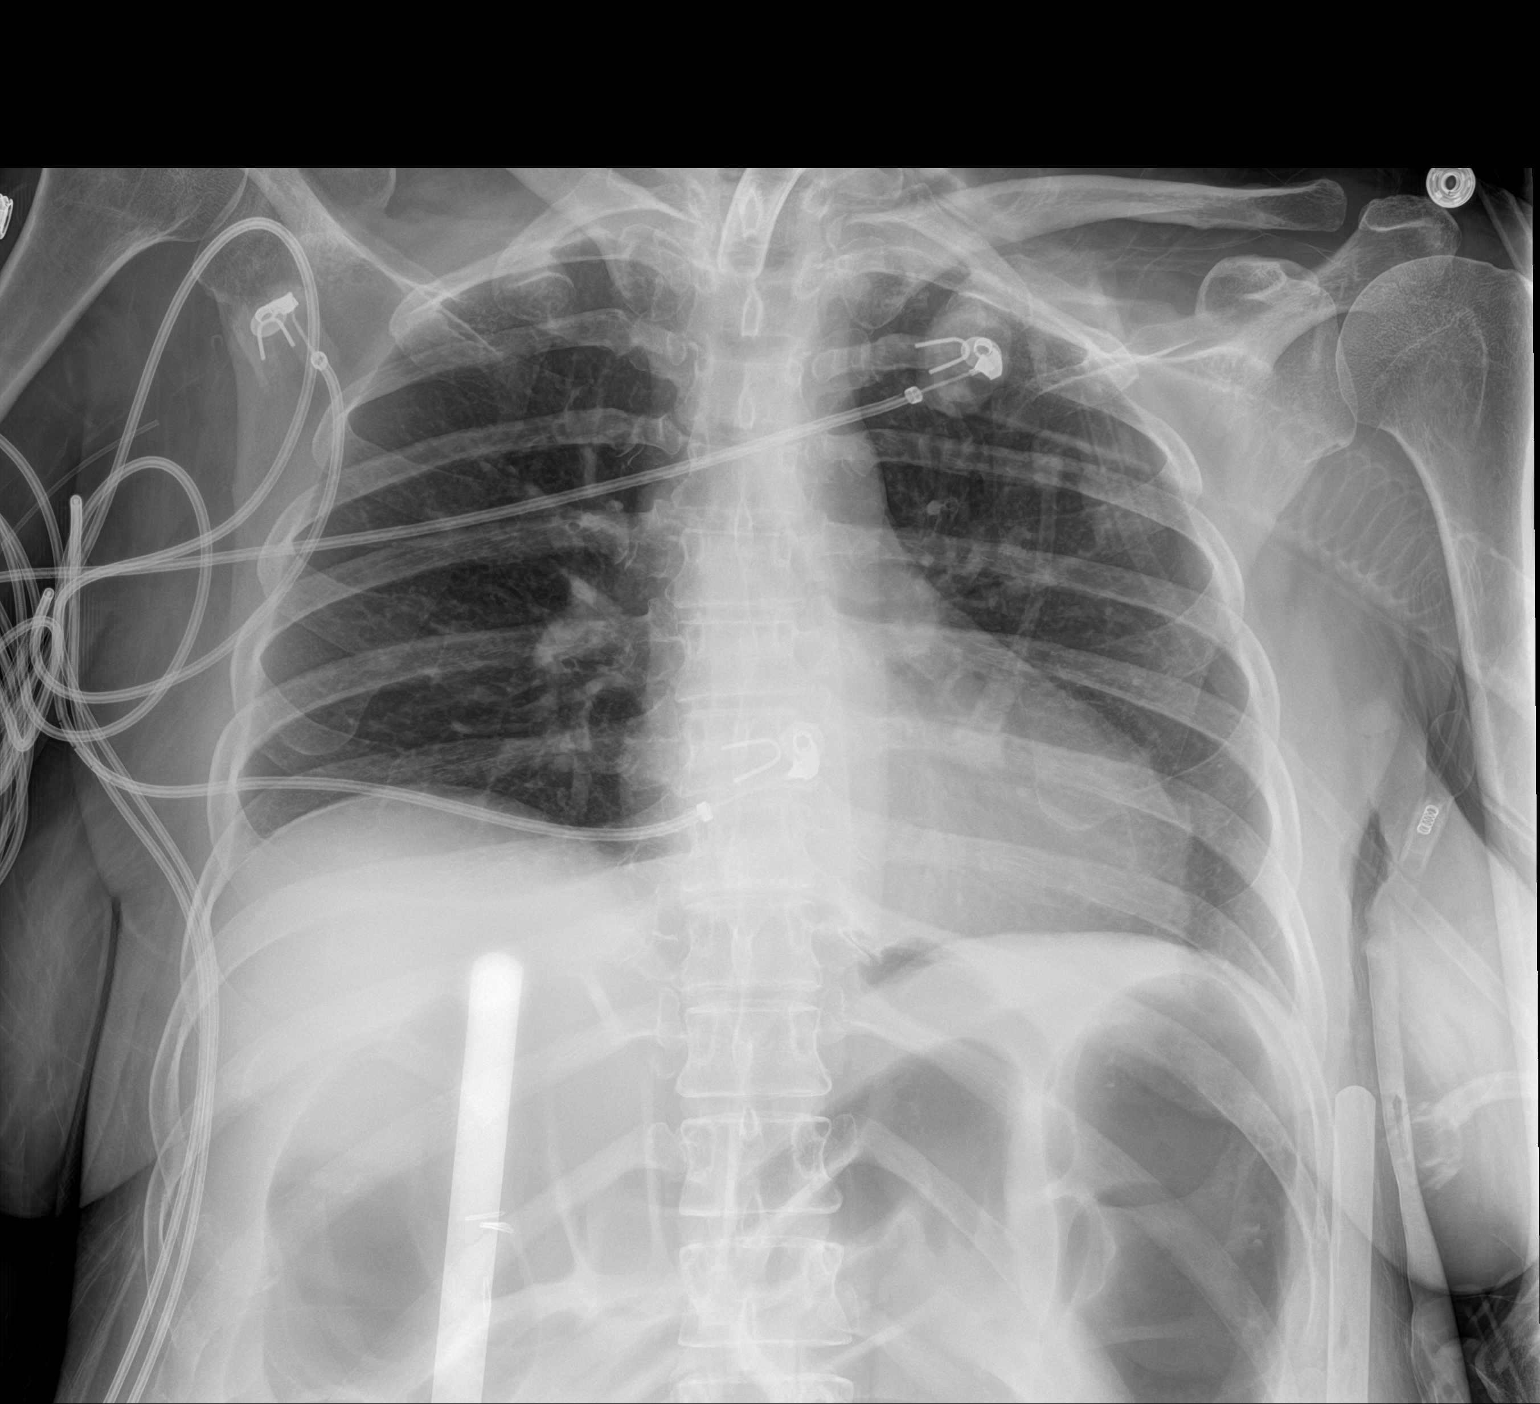

[1 of 1 positions shown; findings below may reference images not displayed]

FINDINGS: Tracheostomy tube is in place. No acute airspace disease or
effusion. Normal heart size. No pneumothorax. Air distension of the
bowel in the upper abdomen.
IMPRESSION: No active disease.

## 2020-05-24 IMAGING — DX ABDOMEN - 1 VIEW
1 series · 1 of 1 positions shown · non-contrast
Comparison: 09/19/2018

CLINICAL DATA: Left upper quadrant abdominal pain nausea and
vomiting

EXAM:
ABDOMEN - 1 VIEW

[abdomen]
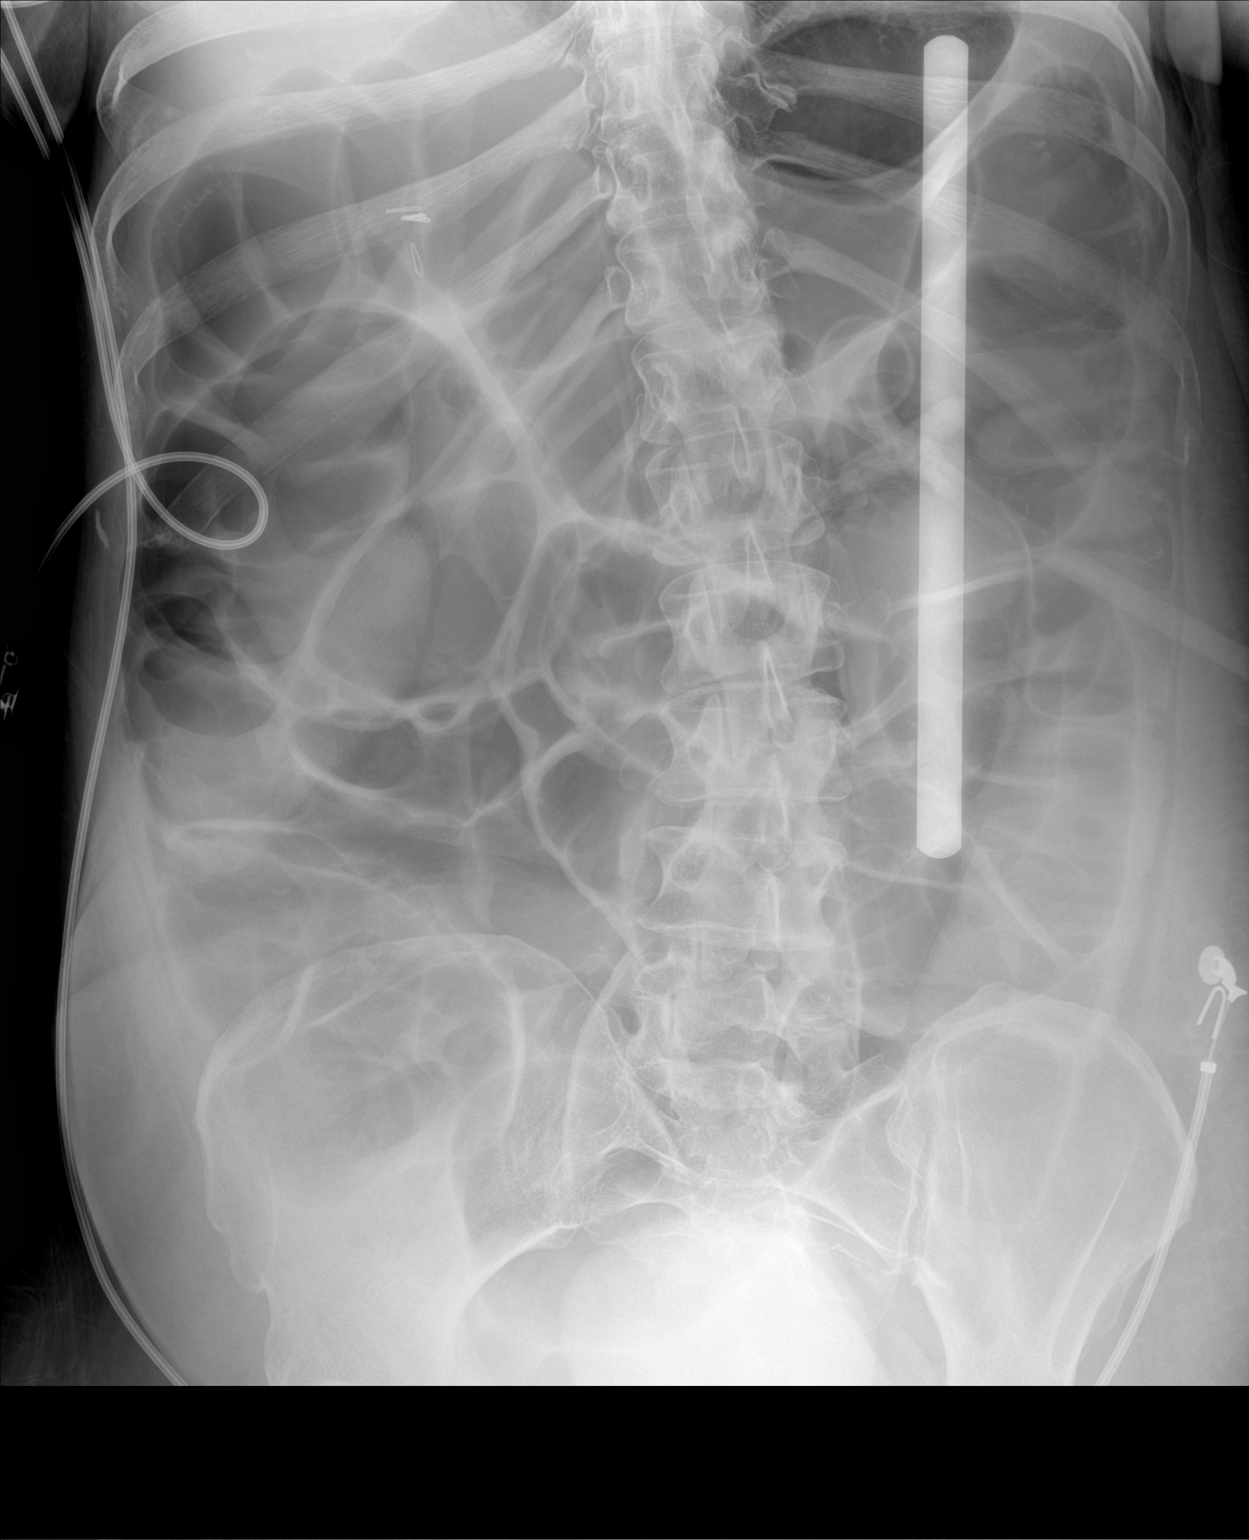

[1 of 1 positions shown; findings below may reference images not displayed]

FINDINGS: Gaseous distention of large bowel and possibly some small bowel
loops as well contrast medium projecting in the lower pelvis is
probably in the urinary bladder. From previous, and small bowel
loops potentially measure up to about 4.0 cm in diameter.

Cholecystectomy clips noted.
IMPRESSION: 1. Continued gaseous distention of large bowel and some central
abdominal loops of small bowel, mildly increased from yesterday's
exam.
